# Patient Record
Sex: Female | Born: 1970 | Race: White | Hispanic: No | Marital: Married | Smoking: Never smoker
Health system: Southern US, Community
[De-identification: ages and names within clinical notes are randomized; demographics above are authoritative.]

## PROBLEM LIST (undated history)

## (undated) DIAGNOSIS — F419 Anxiety disorder, unspecified: Secondary | ICD-10-CM

## (undated) DIAGNOSIS — I10 Essential (primary) hypertension: Secondary | ICD-10-CM

## (undated) DIAGNOSIS — E785 Hyperlipidemia, unspecified: Secondary | ICD-10-CM

## (undated) DIAGNOSIS — D649 Anemia, unspecified: Secondary | ICD-10-CM

## (undated) DIAGNOSIS — Z8719 Personal history of other diseases of the digestive system: Secondary | ICD-10-CM

## (undated) DIAGNOSIS — L4050 Arthropathic psoriasis, unspecified: Secondary | ICD-10-CM

## (undated) DIAGNOSIS — E039 Hypothyroidism, unspecified: Secondary | ICD-10-CM

## (undated) DIAGNOSIS — Z5189 Encounter for other specified aftercare: Secondary | ICD-10-CM

## (undated) DIAGNOSIS — K219 Gastro-esophageal reflux disease without esophagitis: Secondary | ICD-10-CM

## (undated) DIAGNOSIS — T7840XA Allergy, unspecified, initial encounter: Secondary | ICD-10-CM

## (undated) HISTORY — DX: Encounter for other specified aftercare: Z51.89

## (undated) HISTORY — DX: Hypothyroidism, unspecified: E03.9

## (undated) HISTORY — DX: Hyperlipidemia, unspecified: E78.5

## (undated) HISTORY — PX: ABDOMINOPLASTY: SUR9

## (undated) HISTORY — DX: Essential (primary) hypertension: I10

## (undated) HISTORY — DX: Allergy, unspecified, initial encounter: T78.40XA

## (undated) HISTORY — PX: COLONOSCOPY: SHX174

## (undated) HISTORY — DX: Anemia, unspecified: D64.9

## (undated) HISTORY — DX: Anxiety disorder, unspecified: F41.9

## (undated) HISTORY — DX: Gastro-esophageal reflux disease without esophagitis: K21.9

## (undated) HISTORY — DX: Arthropathic psoriasis, unspecified: L40.50

---

## 2002-08-02 HISTORY — PX: LASIK: SHX215

## 2004-11-30 HISTORY — PX: CHOLECYSTECTOMY: SHX55

## 2011-06-11 ENCOUNTER — Institutional Professional Consult (permissible substitution): Payer: Self-pay | Admitting: Emergency Medicine

## 2011-08-16 HISTORY — PX: OTHER SURGICAL HISTORY: SHX169

## 2011-08-16 HISTORY — PX: UMBILICAL HERNIA REPAIR: SHX196

## 2011-08-18 ENCOUNTER — Telehealth: Payer: Self-pay

## 2011-08-18 NOTE — Telephone Encounter (Signed)
I spoke with patient-will be here on Thursday 08-26-11 at 1115 consult with CY.

## 2011-08-26 ENCOUNTER — Ambulatory Visit (INDEPENDENT_AMBULATORY_CARE_PROVIDER_SITE_OTHER): Payer: BC Managed Care – PPO | Admitting: Internal Medicine

## 2011-08-26 ENCOUNTER — Encounter: Payer: Self-pay | Admitting: Internal Medicine

## 2011-08-26 VITALS — BP 112/80 | HR 98 | Ht 66.0 in | Wt 311.8 lb

## 2011-08-26 DIAGNOSIS — R0609 Other forms of dyspnea: Secondary | ICD-10-CM

## 2011-08-26 DIAGNOSIS — E662 Morbid (severe) obesity with alveolar hypoventilation: Secondary | ICD-10-CM | POA: Insufficient documentation

## 2011-08-26 DIAGNOSIS — R0902 Hypoxemia: Secondary | ICD-10-CM

## 2011-08-26 DIAGNOSIS — R0600 Dyspnea, unspecified: Secondary | ICD-10-CM | POA: Insufficient documentation

## 2011-08-26 NOTE — Patient Instructions (Signed)
Order- Schedule PFT at [facility] for dx dyspnea  Order- 2D Echocardiogram at [facility]    Dx hypoxia, dyspnea R/O pulmonary hypertension

## 2011-08-29 ENCOUNTER — Encounter: Payer: Self-pay | Admitting: Internal Medicine

## 2011-08-29 NOTE — Progress Notes (Signed)
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 PCP is Dr. Benedetto Goad. Over the past year she has been aware of increased shortness of breath. Tests of all "okay". Primarily she has noted dyspnea on exertion climbing stairs. She has blamed her weight. She recently had gastric sleeve bariatric surgery 1/17/ 2013 at [facility]. She has already lost 28 pounds. She required reintubation in recovery because of hypoxia without need for tracheostomy. She was discharged with home oxygen from [employer], 2 L/M. With her oximeter, walking around home on 1 L, saturation drops to 90%. Sitting quietly on room air saturation is 90%. Office spirometry  performed between May and October of 2012 show an FEV1/FVC 0.86 without significant obstruction. ABG on room air 03/09/2010 record pH 7.43, PCO2 37, PO2 84, HCO3 24.6. Chest CTA 08/19/2011 at [facility] reported no evidence of pulmonary embolism. There was consolidated infiltrate and a small effusion in the left base and hepatosplenomegaly with small hiatal hernia. Medical history significant for ocular myasthenia gravis treated with prednisone 20 mg daily. Methotrexate between 1999 at 2002 for psoriatic arthritis.Treated with Fen Fen in 1990- not diagnosed with pulmonary hypertension. For the past 6 months she has not slept well saying reflux chokes her. She has mild pill dysphagia and sometimes mild stridor. Mother says she gasps some in her sleep. Lives w/ husband and 2 children.  ROS-see HPI Constitutional:   No-   weight loss, night sweats, fevers, chills, fatigue, lassitude. HEENT:   No-  headaches, difficulty swallowing, tooth/dental problems, sore throat,       No-  sneezing, itching, ear ache, nasal congestion, post nasal drip,  CV:  No-   chest pain, orthopnea, PND,  +swelling in  lower extremities,  or No-anasarca, dizziness, palpitations Resp: + shortness of breath with exertion or at rest.              No-   productive cough,  No non-productive cough,  No- coughing up of blood.              No-   change in color of mucus.  No- wheezing.   Skin: No-   rash or lesions. GI:  + heartburn, indigestion,  No-abdominal pain, nausea, vomiting, diarrhea,                 change in bowel habits, loss of appetite GU:  MS:  No-  acute joint pain or swelling.  No- decreased range of motion.  No- back pain. Neuro-     nothing unusual Psych:  No- change in mood or affect. No depression or anxiety.  No memory loss.  OBJ General- Alert, Oriented, Affect-appropriate, Distress- none acute, overweight  (Mother is overweight, in wheel chair because of arthritis). Portable O2 2 L Skin- rash-none, lesions- none, excoriation- none Lymphadenopathy- none Head- atraumatic            Eyes- Gross vision intact, PERRLA, conjunctivae clear secretions            Ears- Hearing, canals-normal            Nose- Clear, no-Septal dev, mucus, polyps, erosion, perforation             Throat- Mallampati II , mucosa clear , drainage- none, tonsils- atrophic Neck- flexible , trachea midline, no stridor , thyroid nl, carotid no bruit Chest - symmetrical excursion ,  unlabored           Heart/CV- RRR , no murmur , no gallop  , no rub, nl s1 s2  P2 does not seem loud                           - JVD- none , edema- none, stasis changes- none, varices- none           Lung- clear to P&A, wheeze- none, cough- none , dullness-none, rub- none    RA O2 sat here 85% sitting           Chest wall-  Abd- tender-no, distended-no, bowel sounds-present, HSM- no Br/ Gen/ Rectal- Not done, not indicated Extrem- cyanosis- none, clubbing, none, atrophy- none, strength- nl Neuro- grossly intact to observation

## 2011-08-29 NOTE — Assessment & Plan Note (Signed)
She had a pneumonia at the time of her CT scan and a gastric surgery in January 2013. Clinically this has cleared. Without evidence of pulmonary embolism or overt heart failure, or hypoxia probably reflects obesity hypoventilation syndrome. This may be complicated by obstructive sleep apnea at night and/or pulmonary hypertension. Plan-full PFT and echocardiogram looking for evidence of pulmonary hypertension, to be done at [facility]. She wants to wait on sleep study. Meanwhile she continues to lose weight after her bariatric surgery.

## 2011-08-30 ENCOUNTER — Telehealth: Payer: Self-pay | Admitting: Internal Medicine

## 2011-08-30 NOTE — Telephone Encounter (Signed)
Will forward to [facility] for this, thanks

## 2011-08-30 NOTE — Telephone Encounter (Signed)
precert # given to [employer] for [employer] [id number]

## 2011-08-30 NOTE — Telephone Encounter (Signed)
Enrique Sack called back & stated this needs to be completed by 4:00 pm today.  Antionette Fairy

## 2011-09-02 ENCOUNTER — Encounter: Payer: Self-pay | Admitting: Internal Medicine

## 2011-09-09 ENCOUNTER — Encounter: Payer: Self-pay | Admitting: Internal Medicine

## 2011-09-17 ENCOUNTER — Encounter: Payer: Self-pay | Admitting: Internal Medicine

## 2011-09-17 ENCOUNTER — Ambulatory Visit (INDEPENDENT_AMBULATORY_CARE_PROVIDER_SITE_OTHER): Payer: BC Managed Care – PPO | Admitting: Internal Medicine

## 2011-09-17 DIAGNOSIS — R0989 Other specified symptoms and signs involving the circulatory and respiratory systems: Secondary | ICD-10-CM

## 2011-09-17 DIAGNOSIS — G35 Multiple sclerosis: Secondary | ICD-10-CM

## 2011-09-17 DIAGNOSIS — E662 Morbid (severe) obesity with alveolar hypoventilation: Secondary | ICD-10-CM

## 2011-09-17 DIAGNOSIS — J90 Pleural effusion, not elsewhere classified: Secondary | ICD-10-CM

## 2011-09-17 DIAGNOSIS — R0600 Dyspnea, unspecified: Secondary | ICD-10-CM

## 2011-09-17 NOTE — Progress Notes (Signed)
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 PCP is Lindsay Hansen. Over the past year she has been aware of increased shortness of breath. Tests of all "okay". Primarily she has noted dyspnea on exertion climbing stairs. She has blamed her weight. She recently had gastric sleeve bariatric surgery 1/17/ 2013 at [facility]. She has already lost 28 pounds. She required reintubation in recovery because of hypoxia without need for tracheostomy. She was discharged with home oxygen from [employer], 2 L/M. With her oximeter, walking around home on 1 L, saturation drops to 90%. Sitting quietly on room air saturation is 90%. Office spirometry  performed between May and October of 2012 show an FEV1/FVC 0.86 without significant obstruction. ABG on room air 03/09/2010 record pH 7.43, PCO2 37, PO2 84, HCO3 24.6. Chest CTA 08/19/2011 at [facility] reported no evidence of pulmonary embolism. There was consolidated infiltrate and a small effusion in the left base and hepatosplenomegaly with small hiatal hernia. Medical history significant for ocular myasthenia gravis treated with prednisone 20 mg daily. Methotrexate between 1999 at 2002 for psoriatic arthritis.Treated with Fen Fen in 1990- not diagnosed with pulmonary hypertension. For the past 6 months she has not slept well saying reflux chokes her. She has mild pill dysphagia and sometimes mild stridor. Mother says she gasps some in her sleep. Lives w/ husband and 2 children.  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 Complicated by obesity hypoventilation, hx MS,  S/P bariatric surgery 08/19/11   PCP is Lindsay Hansen. She has not needed oxygen in 3 weeks. Resting oxygen saturation in  the mid 90% range. Desaturated to 89% walking on a long he'll. Had neurology followup at [facility] for her myasthenia. Speech slurring so he increased her prednisone to 25 mg daily. She will be starting Cell[employer]. She is now back at work full time. Denies cough or wheeze. Denies reflux. Husband tells her she does not snore or stop breathing so she is not planned to have sleep study done. She has lost 27 pounds since her bariatric surgery. PFT: 08/31/2011. Severe restriction. TLC 40%, FEV1/FVC 0.89. Mild reversible obstructive disease and small airways-FEF 25-75% improved to 23% after bronchodilator. DLCO/VA 48%. Echocardiogram 08/31/2011-EF 60-65%. Normal left and right ventricles. No evidence of pulmonary hypertension.  ROS-see HPI Constitutional:   Deliberate  weight loss, night sweats, fevers, chills, fatigue, lassitude. HEENT:   No-  headaches, difficulty swallowing, tooth/dental problems, sore throat,       No-  sneezing, itching, ear ache, nasal congestion, post nasal drip,  CV:  No-   chest pain, orthopnea, PND,  +swelling in lower extremities,  or No-anasarca, dizziness, palpitations Resp: + shortness of breath with exertion or at rest.              No-   productive cough,  No non-productive cough,  No- coughing up of blood.              No-   change in color of mucus.  No- wheezing.   Skin: No-   rash or lesions. GI:  + heartburn, indigestion,  No-abdominal pain, nausea, vomiting, diarrhea,                 change in bowel habits, loss of appetite GU:  MS:  No-  acute  joint pain or swelling.  No- decreased range of motion.  No- back pain. Neuro-     nothing unusual Psych:  No- change in mood or affect. No depression or anxiety.  No memory loss.  OBJ General- Alert, Oriented, Affect-appropriate, Distress- none acute, overweight  (Mother is overweight, in wheel chair because of arthritis). Portable O2 2 L Skin- rash-none, lesions- none, excoriation- none Lymphadenopathy- none Head-  atraumatic            Eyes- Gross vision intact, PERRLA, conjunctivae clear secretions            Ears- Hearing, canals-normal            Nose- Clear, no-Septal dev, mucus, polyps, erosion, perforation             Throat- Mallampati II , mucosa clear , drainage- none, tonsils- atrophic Neck- flexible , trachea midline, no stridor , thyroid nl, carotid no bruit Chest - symmetrical excursion , unlabored           Heart/CV- RRR , no murmur , no gallop  , no rub, nl s1 s2  P2 does not seem loud                           - JVD- none , edema- none, stasis changes- none, varices- none           Lung- clear to P&A, wheeze- none, cough- none , dullness-none, rub- none    RA O2 sat here 85% sitting           Chest wall-  Abd- Br/ Gen/ Rectal- Not done, not indicated Extrem- cyanosis- none, clubbing, none, atrophy- none, strength- nl Neuro- grossly intact to observation

## 2011-09-17 NOTE — Patient Instructions (Signed)
Your PFT shows severe restriction of Total Lung Capacity at this time, and should improve as you lose weight. Re-absorption of the effusion will also help.  There is very mild reactive small airways obstruction, that may be worse if you have a cold, and can be treated with asthma meds as needed.  There is no evidence of pulmonary hypertension or impaired cardiac contractility.  Please call as needed

## 2011-09-19 DIAGNOSIS — J90 Pleural effusion, not elsewhere classified: Secondary | ICD-10-CM | POA: Insufficient documentation

## 2011-09-19 DIAGNOSIS — G35 Multiple sclerosis: Secondary | ICD-10-CM | POA: Insufficient documentation

## 2011-09-19 NOTE — Assessment & Plan Note (Signed)
Small pleural effusion suggested by echocardiogram. This is likely a postoperative inflammatory transudate that will reabsorb.

## 2012-08-02 HISTORY — PX: THYMECTOMY: SHX1063

## 2014-08-02 HISTORY — PX: ABDOMINOPLASTY: SUR9

## 2016-11-18 ENCOUNTER — Other Ambulatory Visit: Payer: Self-pay | Admitting: Obstetrics and Gynecology

## 2016-11-18 DIAGNOSIS — Z9189 Other specified personal risk factors, not elsewhere classified: Secondary | ICD-10-CM

## 2016-12-04 ENCOUNTER — Ambulatory Visit
Admission: RE | Admit: 2016-12-04 | Discharge: 2016-12-04 | Disposition: A | Discharge status: Home / Self Care | Payer: Managed Care, Other (non HMO) | Source: Ambulatory Visit | Attending: Obstetrics and Gynecology | Admitting: Obstetrics and Gynecology

## 2016-12-04 DIAGNOSIS — Z9189 Other specified personal risk factors, not elsewhere classified: Secondary | ICD-10-CM

## 2016-12-04 MED ORDER — GADOBENATE DIMEGLUMINE 529 MG/ML IV SOLN
20.0000 mL | Freq: Once | INTRAVENOUS | Status: AC | PRN
  Administered 2016-12-04: 20 mL via INTRAVENOUS

## 2020-06-19 ENCOUNTER — Encounter: Payer: Self-pay | Admitting: Gastroenterology

## 2020-07-11 ENCOUNTER — Encounter: Payer: Self-pay | Admitting: *Deleted

## 2020-07-28 ENCOUNTER — Ambulatory Visit (AMBULATORY_SURGERY_CENTER): Payer: Self-pay | Admitting: *Deleted

## 2020-07-28 ENCOUNTER — Other Ambulatory Visit: Payer: Self-pay

## 2020-07-28 VITALS — Ht 66.0 in | Wt 298.0 lb

## 2020-07-28 DIAGNOSIS — Z1211 Encounter for screening for malignant neoplasm of colon: Secondary | ICD-10-CM

## 2020-07-28 MED ORDER — CLENPIQ 10-3.5-12 MG-GM -GM/160ML PO SOLN: 1.0000 | ORAL | 0 refills | Status: DC

## 2020-07-28 NOTE — Progress Notes (Signed)
No egg or soy allergy known to patient  No issues with past sedation with any surgeries or procedures No intubation problems in the past  No FH of Malignant Hyperthermia No diet pills per patient No home 02 use per patient  No blood thinners per patient  Pt denies issues with constipation  No A fib or A flutter  EMMI video to pt or via MyChart  COVID 19 guidelines implemented in PV today with Pt and RN  Pt is fully vaccinated  for Covid   Clenpiq Coupon given to pt in PV today , Code to Pharmacy   Due to the COVID-19 pandemic we are asking patients to follow certain guidelines.  Pt aware of COVID protocols and [facility] guidelines   

## 2020-08-02 HISTORY — PX: BACK SURGERY: SHX140

## 2020-08-05 ENCOUNTER — Encounter: Payer: Self-pay | Admitting: Gastroenterology

## 2020-08-11 ENCOUNTER — Encounter: Payer: Self-pay | Admitting: Gastroenterology

## 2020-08-11 ENCOUNTER — Ambulatory Visit (AMBULATORY_SURGERY_CENTER): Payer: 59 | Admitting: Gastroenterology

## 2020-08-11 ENCOUNTER — Other Ambulatory Visit: Payer: Self-pay

## 2020-08-11 VITALS — BP 123/78 | HR 83 | Temp 97.1°F | Resp 16 | Ht 66.0 in | Wt 298.0 lb

## 2020-08-11 DIAGNOSIS — D129 Benign neoplasm of anus and anal canal: Secondary | ICD-10-CM

## 2020-08-11 DIAGNOSIS — Z1211 Encounter for screening for malignant neoplasm of colon: Secondary | ICD-10-CM

## 2020-08-11 DIAGNOSIS — D120 Benign neoplasm of cecum: Secondary | ICD-10-CM

## 2020-08-11 DIAGNOSIS — D122 Benign neoplasm of ascending colon: Secondary | ICD-10-CM | POA: Diagnosis not present

## 2020-08-11 DIAGNOSIS — D125 Benign neoplasm of sigmoid colon: Secondary | ICD-10-CM | POA: Diagnosis not present

## 2020-08-11 DIAGNOSIS — D128 Benign neoplasm of rectum: Secondary | ICD-10-CM

## 2020-08-11 MED ORDER — SODIUM CHLORIDE 0.9 % IV SOLN: 500.0000 mL | Freq: Once | INTRAVENOUS | Status: DC

## 2020-08-11 NOTE — Progress Notes (Signed)
Called to room to assist during endoscopic procedure.  Patient ID and intended procedure confirmed with present staff. Received instructions for my participation in the procedure from the performing physician.  

## 2020-08-11 NOTE — Op Note (Signed)
[facility] Patient Name: Lindsay Hansen Procedure Date: 08/11/2020 10:02 AM MRN: [id number] Endoscopist: Lynann Bologna , MD Age: 50 Referring MD:  Date of Birth: June 05, 1971 Gender: Female Account #: [id number] Procedure:                Colonoscopy Indications:              Screening for colorectal malignant neoplasm Medicines:                Monitored Anesthesia Care Procedure:                Pre-Anesthesia Assessment:                           - Prior to the procedure, a History and Physical                            was performed, and patient medications and                            allergies were reviewed. The patient's tolerance of                            previous anesthesia was also reviewed. The risks                            and benefits of the procedure and the sedation                            options and risks were discussed with the patient.                            All questions were answered, and informed consent                            was obtained. Prior Anticoagulants: The patient has                            taken no previous anticoagulant or antiplatelet                            agents. ASA Grade Assessment: II - A patient with                            mild systemic disease. After reviewing the risks                            and benefits, the patient was deemed in                            satisfactory condition to undergo the procedure.                           After obtaining informed consent, the colonoscope  was passed under direct vision. Throughout the                            procedure, the patient's blood pressure, pulse, and                            oxygen saturations were monitored continuously. The                            Olympus PCF-H190DL (#[id number]) Colonoscope was                            introduced through the anus and advanced to the 2                            cm into the ileum.  The colonoscopy was performed                            without difficulty. The patient tolerated the                            procedure well. The quality of the bowel                            preparation was good. The terminal ileum, ileocecal                            valve, appendiceal orifice, and rectum were                            photographed. Scope In: 10:07:14 AM Scope Out: 10:23:51 AM Scope Withdrawal Time: 0 hours 14 minutes 3 seconds  Total Procedure Duration: 0 hours 16 minutes 37 seconds  Findings:                 Two sessile polyps were found in the proximal                            ascending colon and cecum. The polyps were 4 to 6                            mm in size. These polyps were removed with a cold                            snare. Resection and retrieval were complete.                           Four sessile polyps were found in the rectum and                            distal sigmoid colon. The polyps were 2 to 4 mm in  size. These polyps were removed with a cold snare.                            Resection and retrieval were complete.                           A few rare small-mouthed diverticula were found in                            the sigmoid colon.                           Non-bleeding internal hemorrhoids were found during                            retroflexion. The hemorrhoids were small.                           The terminal ileum appeared normal.                           The exam was otherwise without abnormality on                            direct and retroflexion views. Complications:            No immediate complications. Estimated Blood Loss:     Estimated blood loss: none. Impression:               - Small colonic polyps s/p polypectomy.                           - Minimal sigmoid diverticulosis.                           - Otherwise normal colonoscopy to TI. Recommendation:           - Patient has a  contact number available for                            emergencies. The signs and symptoms of potential                            delayed complications were discussed with the                            patient. Return to normal activities tomorrow.                            Written discharge instructions were provided to the                            patient.                           - Resume previous diet.                           -  Continue present medications.                           - Await pathology results.                           - Repeat colonoscopy for surveillance based on                            pathology results.                           - The findings and recommendations were discussed                            with Lindsay Hansen. Jackquline Denmark, MD 08/11/2020 10:32:42 AM This report has been signed electronically.

## 2020-08-11 NOTE — Patient Instructions (Signed)
Handouts given:  Polyps, Diverticulosis Resume previous diet Continue current medications Await pathology results YOU HAD AN ENDOSCOPIC PROCEDURE TODAY AT THE [facility]:   Refer to the procedure report that was given to you for any specific questions about what was found during the examination.  If the procedure report does not answer your questions, please call your gastroenterologist to clarify.  If you requested that your care partner not be given the details of your procedure findings, then the procedure report has been included in a sealed envelope for you to review at your convenience later.  YOU SHOULD EXPECT: Some feelings of bloating in the abdomen. Passage of more gas than usual.  Walking can help get rid of the air that was put into your GI tract during the procedure and reduce the bloating. If you had a lower endoscopy (such as a colonoscopy or flexible sigmoidoscopy) you may notice spotting of blood in your stool or on the toilet paper. If you underwent a bowel prep for your procedure, you may not have a normal bowel movement for a few days.  Please Note:  You might notice some irritation and congestion in your nose or some drainage.  This is from the oxygen used during your procedure.  There is no need for concern and it should clear up in a day or so.  SYMPTOMS TO REPORT IMMEDIATELY:   Following lower endoscopy (colonoscopy or flexible sigmoidoscopy):  Excessive amounts of blood in the stool  Significant tenderness or worsening of abdominal pains  Swelling of the abdomen that is new, acute  Fever of 100F or higher  For urgent or emergent issues, a gastroenterologist can be reached at any hour by calling [phone]. Do not use MyChart messaging for urgent concerns.   DIET:  We do recommend a small meal at first, but then you may proceed to your regular diet.  Drink plenty of fluids but you should avoid alcoholic beverages for 24 hours.  ACTIVITY:  You should  plan to take it easy for the rest of today and you should NOT DRIVE or use heavy machinery until tomorrow (because of the sedation medicines used during the test).    FOLLOW UP: Our staff will call the number listed on your records 48-72 hours following your procedure to check on you and address any questions or concerns that you may have regarding the information given to you following your procedure. If we do not reach you, we will leave a message.  We will attempt to reach you two times.  During this call, we will ask if you have developed any symptoms of COVID 19. If you develop any symptoms (ie: fever, flu-like symptoms, shortness of breath, cough etc.) before then, please call [phone].  If you test positive for Covid 19 in the 2 weeks post procedure, please call and report this information to us.    If any biopsies were taken you will be contacted by phone or by letter within the next 1-3 weeks.  Please call us at [phone] if you have not heard about the biopsies in 3 weeks.   SIGNATURES/CONFIDENTIALITY: You and/or your care partner have signed paperwork which will be entered into your electronic medical record.  These signatures attest to the fact that that the information above on your After Visit Summary has been reviewed and is understood.  Full responsibility of the confidentiality of this discharge information lies with you and/or your care-partner. 

## 2020-08-11 NOTE — Addendum Note (Signed)
Addended by: Lanier Prude A on: 08/11/2020 03:11 PM   Modules accepted: Orders

## 2020-08-11 NOTE — Progress Notes (Signed)
Pt's states no medical or surgical changes since previsit or office visit.   VS taken by CW 

## 2020-08-11 NOTE — Progress Notes (Signed)
Report to PACU, RN, vss, BBS= Clear.  

## 2020-08-13 ENCOUNTER — Telehealth: Payer: Self-pay | Admitting: *Deleted

## 2020-08-13 NOTE — Telephone Encounter (Signed)
First follow up call attempt.  Message left to call with any questions or concerns. 

## 2020-08-18 ENCOUNTER — Encounter: Payer: Self-pay | Admitting: Gastroenterology

## 2020-12-15 ENCOUNTER — Other Ambulatory Visit: Payer: Self-pay | Admitting: Specialist

## 2020-12-15 DIAGNOSIS — M545 Low back pain, unspecified: Secondary | ICD-10-CM

## 2020-12-15 DIAGNOSIS — R29898 Other symptoms and signs involving the musculoskeletal system: Secondary | ICD-10-CM

## 2020-12-25 ENCOUNTER — Other Ambulatory Visit: Payer: 59

## 2021-06-11 ENCOUNTER — Other Ambulatory Visit: Payer: Self-pay | Admitting: Neurosurgery

## 2021-06-15 ENCOUNTER — Encounter (HOSPITAL_COMMUNITY): Payer: Self-pay

## 2021-06-15 NOTE — Progress Notes (Signed)
DUE TO COVID-19 ONLY ONE VISITOR IS ALLOWED TO COME WITH YOU AND STAY IN THE WAITING ROOM ONLY DURING PRE OP AND PROCEDURE DAY OF SURGERY.   Two VISITORS MAY VISIT WITH YOU AFTER SURGERY IN YOUR PRIVATE ROOM DURING VISITING HOURS ONLY!  Cardiologist - n/a Internal Med - Edyth Gunnels, NP / Dr Bea Graff Neurology - Dr Tedra Coupe  Chest x-ray - n/a EKG - 06/16/21 Stress Test - n/a ECHO - 08/31/11 Cardiac Cath - n/a  ICD Pacemaker/Loop - n/a  Sleep Study -  n/a CPAP - none  Anesthesia review: Yes  STOP now taking any Aspirin (unless otherwise instructed by your surgeon), Aleve, Naproxen, Ibuprofen, Motrin, Advil, Goody's, BC's, all herbal medications, fish oil, and all vitamins.   Coronavirus Screening Covid test is scheduled on 06/16/21. Do you have any of the following symptoms:  Cough yes/no: No Fever (>100.2F)  yes/no: No Runny nose yes/no: No Sore throat yes/no: No Difficulty breathing/shortness of breath  yes/no: No  Have you traveled in the last 14 days and where? yes/no: No  Patient verbalized understanding of instructions that were given to them at the PAT appointment. Patient was also instructed that they will need to review over the PAT instructions again at home before surgery.

## 2021-06-15 NOTE — Pre-Procedure Instructions (Signed)
Surgical Instructions    Your procedure is scheduled on Wednesday, 06/17/21.  Report to [facility] Main Entrance "A" at 12:50 P.M., then check in with the Admitting office.  Call this number if you have problems the morning of surgery:  [phone]   If you have any questions prior to your surgery date call [phone]: Open Monday-Friday 8am-4pm    Remember:  Do not eat or drink after midnight the night before your surgery    Take these medicines the morning of surgery with A SIP OF WATER:  lansoprazole (PREVACID) levothyroxine (SYNTHROID)  rosuvastatin (CRESTOR)  sertraline (ZOLOFT)    Take these medicines the morning of surgery with A SIP OF WATER AS NEEDED:  acetaminophen (TYLENOL) diazepam (VALIUM)  famotidine (PEPCID) oxyCODONE-acetaminophen (PERCOCET/ROXICET) promethazine (PHENERGAN)   As of today, STOP taking any celecoxib (CELEBREX), Aspirin (unless otherwise instructed by your surgeon) Aleve, Naproxen, Ibuprofen, Motrin, Advil, Goody's, BC's, all herbal medications, fish oil, and all vitamins.    After your COVID test   You are not required to quarantine however you are required to wear a well-fitting mask when you are out and around people not in your household.  If your mask becomes wet or soiled, replace with a new one.  Wash your hands often with soap and water for 20 seconds or clean your hands with an alcohol-based hand sanitizer that contains at least 60% alcohol.  Do not share personal items.  Notify your provider: if you are in close contact with someone who has COVID  or if you develop a fever of 100.4 or greater, sneezing, cough, sore throat, shortness of breath or body aches.             Do not wear jewelry or makeup Do not wear lotions, powders, perfumes, or deodorant. Do not shave 48 hours prior to surgery.   Do not bring valuables to the hospital. DO Not wear nail polish, gel polish, artificial nails, or any other type of covering on  natural nails including finger and toenails. If patients have artificial nails, gel coating, etc. that need to be removed by a nail salon, please have this removed prior to surgery or surgery may need to be canceled/delayed if the surgeon/ anesthesia feels like the patient is unable to be adequately monitored.             [facility] is not responsible for any belongings or valuables.  Do NOT Smoke (Tobacco/Vaping)  24 hours prior to your procedure  If you use a CPAP at night, you may bring your mask for your overnight stay.   Contacts, glasses, hearing aids, dentures or partials may not be worn into surgery, please bring cases for these belongings   For patients admitted to the hospital, discharge time will be determined by your treatment team.   Patients discharged the day of surgery will not be allowed to drive home, and someone needs to stay with them for 24 hours.  NO VISITORS WILL BE ALLOWED IN PRE-OP WHERE PATIENTS ARE PREPPED FOR SURGERY.  ONLY 1 SUPPORT PERSON MAY BE PRESENT IN THE WAITING ROOM WHILE YOU ARE IN SURGERY.  IF YOU ARE TO BE ADMITTED, ONCE YOU ARE IN YOUR ROOM YOU WILL BE ALLOWED TWO (2) VISITORS. 1 (ONE) VISITOR MAY STAY OVERNIGHT BUT MUST ARRIVE TO THE ROOM BY 8pm.  Minor children may have two parents present. Special consideration for safety and communication needs will be reviewed on a case by case basis.  Special instructions:  Oral Hygiene is also important to reduce your risk of infection.  Remember - BRUSH YOUR TEETH THE MORNING OF SURGERY WITH YOUR REGULAR TOOTHPASTE   [place]- Preparing For Surgery  Before surgery, you can play an important role. Because skin is not sterile, your skin needs to be as free of germs as possible. You can reduce the number of germs on your skin by washing with CHG (chlorahexidine gluconate) Soap before surgery.  CHG is an antiseptic cleaner which kills germs and bonds with the skin to continue killing germs even after washing.      Please do not use if you have an allergy to CHG or antibacterial soaps. If your skin becomes reddened/irritated stop using the CHG.  Do not shave (including legs and underarms) for at least 48 hours prior to first CHG shower. It is OK to shave your face.  Please follow these instructions carefully.     Shower the NIGHT BEFORE SURGERY and the MORNING OF SURGERY with CHG Soap.   If you chose to wash your hair, wash your hair first as usual with your normal shampoo. After you shampoo, rinse your hair and body thoroughly to remove the shampoo.  Then [employer] and genitals (private parts) with your normal soap and rinse thoroughly to remove soap.  After that Use CHG Soap as you would any other liquid soap. You can apply CHG directly to the skin and wash gently with a scrungie or a clean washcloth.   Apply the CHG Soap to your body ONLY FROM THE NECK DOWN.  Do not use on open wounds or open sores. Avoid contact with your eyes, ears, mouth and genitals (private parts). Wash Face and genitals (private parts)  with your normal soap.   Wash thoroughly, paying special attention to the area where your surgery will be performed.  Thoroughly rinse your body with warm water from the neck down.  DO NOT shower/wash with your normal soap after using and rinsing off the CHG Soap.  Pat yourself dry with a CLEAN TOWEL.  Wear CLEAN PAJAMAS to bed the night before surgery  Place CLEAN SHEETS on your bed the night before your surgery  DO NOT SLEEP WITH PETS.   Day of Surgery:  Take a shower with CHG soap. Wear Clean/Comfortable clothing the morning of surgery Do not apply any deodorants/lotions.   Remember to brush your teeth WITH YOUR REGULAR TOOTHPASTE.   Please read over the following fact sheets that you were given.

## 2021-06-16 ENCOUNTER — Other Ambulatory Visit: Payer: Self-pay | Admitting: Neurosurgery

## 2021-06-16 ENCOUNTER — Encounter (HOSPITAL_COMMUNITY)
Admission: RE | Admit: 2021-06-16 | Discharge: 2021-06-16 | Disposition: A | Discharge status: Home / Self Care | Payer: Managed Care, Other (non HMO) | Source: Ambulatory Visit | Attending: Neurosurgery | Admitting: Neurosurgery

## 2021-06-16 ENCOUNTER — Other Ambulatory Visit: Payer: Self-pay

## 2021-06-16 ENCOUNTER — Encounter (HOSPITAL_COMMUNITY): Payer: Self-pay

## 2021-06-16 VITALS — BP 164/106 | HR 72 | Temp 97.7°F | Resp 19 | Ht 65.5 in | Wt 298.0 lb

## 2021-06-16 DIAGNOSIS — L4050 Arthropathic psoriasis, unspecified: Secondary | ICD-10-CM | POA: Insufficient documentation

## 2021-06-16 DIAGNOSIS — Z20822 Contact with and (suspected) exposure to covid-19: Secondary | ICD-10-CM | POA: Insufficient documentation

## 2021-06-16 DIAGNOSIS — Z79899 Other long term (current) drug therapy: Secondary | ICD-10-CM | POA: Diagnosis not present

## 2021-06-16 DIAGNOSIS — Z01818 Encounter for other preprocedural examination: Secondary | ICD-10-CM | POA: Insufficient documentation

## 2021-06-16 LAB — TYPE AND SCREEN
ABO/RH(D): O POS
Antibody Screen: NEGATIVE

## 2021-06-16 LAB — BASIC METABOLIC PANEL
Anion gap: 7 (ref 5–15)
BUN: 13 mg/dL (ref 6–20)
CO2: 30 mmol/L (ref 22–32)
Calcium: 9.2 mg/dL (ref 8.9–10.3)
Chloride: 99 mmol/L (ref 98–111)
Creatinine, Ser: 0.88 mg/dL (ref 0.44–1.00)
GFR, Estimated: >60 mL/min (ref >60)
Glucose, Bld: 98 mg/dL (ref 70–99)
Potassium: 3.9 mmol/L (ref 3.5–5.1)
Sodium: 136 mmol/L (ref 135–145)

## 2021-06-16 LAB — CBC
HCT: 37.7 % (ref 36.0–46.0)
Hemoglobin: 12 g/dL (ref 12.0–15.0)
MCH: 28.2 pg (ref 26.0–34.0)
MCHC: 31.8 g/dL (ref 30.0–36.0)
MCV: 88.5 fL (ref 80.0–100.0)
Platelets: 321 10*3/uL (ref 150–400)
RBC: 4.26 MIL/uL (ref 3.87–5.11)
RDW: 15.9 % — ABNORMAL HIGH (ref 11.5–15.5)
WBC: 9 10*3/uL (ref 4.0–10.5)
nRBC: 0 % (ref 0.0–0.2)

## 2021-06-16 LAB — SARS CORONAVIRUS 2 (TAT 6-24 HRS): SARS Coronavirus 2: NEGATIVE

## 2021-06-16 LAB — SURGICAL PCR SCREEN
MRSA, PCR: NEGATIVE
Staphylococcus aureus: NEGATIVE

## 2021-06-16 NOTE — Anesthesia Preprocedure Evaluation (Addendum)
Anesthesia Evaluation  Patient identified by MRN, date of birth, ID band Patient awake    Reviewed: Allergy & Precautions, NPO status , Patient's Chart, lab work & pertinent test results  Airway Mallampati: III  TM Distance: >3 FB Neck ROM: Full    Dental  (+) Teeth Intact, Dental Advisory Given   Pulmonary  Denies snoring at night    Pulmonary exam normal breath sounds clear to auscultation       Cardiovascular hypertension (152/93 in preop, normally 120-130s SBP), Pt. on medications Normal cardiovascular exam Rhythm:Regular Rate:Normal     Neuro/Psych PSYCHIATRIC DISORDERS Anxiety Follows with neurology at [facility] for history of myasthenia gravis.  Her symptoms are predominantly ocular bulbar.  Last seen 02/05/2021.  Per note, "Impression: Lindsay Hansen is a 50 y.o. female with seropositive myasthenia gravis presenting for followup. Largely and currently asymptomatic on hydrocortisone 20/10 mg and off of MG specific immunosuppression (though she is being treated for psoriatic arthritis).   Neuromuscular disease    GI/Hepatic Neg liver ROS, GERD  Controlled and Medicated,S/p gastric sleeve    Endo/Other  Hypothyroidism   Renal/GU negative Renal ROS  negative genitourinary   Musculoskeletal  (+) Arthritis , Osteoarthritis,  Psoriatic arthritis- 10mg  prednisone daily for many years   Abdominal (+) + obese,   Peds negative pediatric ROS (+)  Hematology negative hematology ROS (+) hct 37.7   Anesthesia Other Findings Has been taking oxy 10mg  for last week  Reproductive/Obstetrics negative OB ROS                           Anesthesia Physical Anesthesia Plan  ASA: 3  Anesthesia Plan: General   Post-op Pain Management:    Induction: Intravenous  PONV Risk Score and Plan: 3 and Ondansetron, Dexamethasone, Midazolam and Treatment may vary due to age or medical condition  Airway  Management Planned: Oral ETT  Additional Equipment: None  Intra-op Plan:   Post-operative Plan: Extubation in OR  Informed Consent: I have reviewed the patients History and Physical, chart, labs and discussed the procedure including the risks, benefits and alternatives for the proposed anesthesia with the patient or authorized representative who has indicated his/her understanding and acceptance.     Dental advisory given  Plan Discussed with: CRNA  Anesthesia Plan Comments: (  )       Anesthesia Quick Evaluation

## 2021-06-16 NOTE — Progress Notes (Signed)
Anesthesia Chart Review:  Follows with neurology at [facility] for history of myasthenia gravis.  Her symptoms are predominantly ocular bulbar.  Last seen 02/05/2021.  Per note, "Impression: Lindsay Hansen is a 50 y.o. female with seropositive myasthenia gravis presenting for followup. Largely and currently asymptomatic on hydrocortisone 20/10 mg and off of MG specific immunosuppression (though she is being treated for psoriatic arthritis). She recently had symptoms suggestive of neurogenic claudication with MRI lumbar spine showing moderate L3-L4 canal stenosis, symptoms improved with steroid injection. She is being followed locally by neurosurgery for this."  She was advised to follow-up in 1 year.  History of psoriatic arthritis, maintained on Rinvoq.  History of gastric sleeve 2013.  Hypertension not well controlled preop testing appointment, 164/106.  Preop labs reviewed, unremarkable.  EKG 06/16/2021: NSR.  Rate 64.   Wynonia Musty [facility] Short Stay Center/Anesthesiology Phone [phone] 06/16/2021 2:55 PM

## 2021-06-17 ENCOUNTER — Ambulatory Visit (HOSPITAL_COMMUNITY): Payer: Managed Care, Other (non HMO)

## 2021-06-17 ENCOUNTER — Ambulatory Visit (HOSPITAL_COMMUNITY): Payer: Managed Care, Other (non HMO) | Admitting: Certified Registered Nurse Anesthetist

## 2021-06-17 ENCOUNTER — Ambulatory Visit (HOSPITAL_COMMUNITY): Payer: Managed Care, Other (non HMO) | Admitting: Physician Assistant

## 2021-06-17 ENCOUNTER — Other Ambulatory Visit: Payer: Self-pay

## 2021-06-17 ENCOUNTER — Encounter (HOSPITAL_COMMUNITY): Payer: Self-pay | Admitting: Neurosurgery

## 2021-06-17 ENCOUNTER — Observation Stay (HOSPITAL_COMMUNITY)
Admission: RE | Admit: 2021-06-17 | Discharge: 2021-06-18 | Disposition: A | Discharge status: Home / Self Care | Payer: Managed Care, Other (non HMO) | Attending: Neurosurgery | Admitting: Neurosurgery

## 2021-06-17 ENCOUNTER — Encounter (HOSPITAL_COMMUNITY)
Admission: RE | Disposition: A | Discharge status: Home / Self Care | Payer: Self-pay | Source: Home / Self Care | Attending: Neurosurgery

## 2021-06-17 DIAGNOSIS — Z79899 Other long term (current) drug therapy: Secondary | ICD-10-CM | POA: Diagnosis not present

## 2021-06-17 DIAGNOSIS — M4316 Spondylolisthesis, lumbar region: Secondary | ICD-10-CM | POA: Insufficient documentation

## 2021-06-17 DIAGNOSIS — I10 Essential (primary) hypertension: Secondary | ICD-10-CM | POA: Insufficient documentation

## 2021-06-17 DIAGNOSIS — M48062 Spinal stenosis, lumbar region with neurogenic claudication: Principal | ICD-10-CM | POA: Insufficient documentation

## 2021-06-17 DIAGNOSIS — E039 Hypothyroidism, unspecified: Secondary | ICD-10-CM | POA: Diagnosis not present

## 2021-06-17 DIAGNOSIS — Z419 Encounter for procedure for purposes other than remedying health state, unspecified: Secondary | ICD-10-CM

## 2021-06-17 LAB — ABO/RH: ABO/RH(D): O POS

## 2021-06-17 SURGERY — POSTERIOR LUMBAR FUSION 1 LEVEL
Anesthesia: General

## 2021-06-17 MED ORDER — CHLORHEXIDINE GLUCONATE CLOTH 2 % EX PADS: 6.0000 | MEDICATED_PAD | Freq: Once | CUTANEOUS | Status: DC

## 2021-06-17 MED ORDER — CYANOCOBALAMIN 1000 MCG/ML IJ KIT: 1000.0000 ug | PACK | INTRAMUSCULAR | Status: DC

## 2021-06-17 MED ORDER — PROPOFOL 10 MG/ML IV BOLUS
INTRAVENOUS | Status: AC
  Filled 2021-06-17: qty 20

## 2021-06-17 MED ORDER — ESTRADIOL 0.1 MG/24HR TD PTWK: 0.1000 mg | MEDICATED_PATCH | TRANSDERMAL | Status: DC

## 2021-06-17 MED ORDER — VITAMIN D (ERGOCALCIFEROL) 1.25 MG (50000 UNIT) PO CAPS: 50000.0000 [IU] | ORAL_CAPSULE | ORAL | Status: DC

## 2021-06-17 MED ORDER — LIDOCAINE-EPINEPHRINE 1 %-1:100000 IJ SOLN
INTRAMUSCULAR | Status: AC
  Filled 2021-06-17: qty 1

## 2021-06-17 MED ORDER — ACETAMINOPHEN 650 MG RE SUPP: 650.0000 mg | RECTAL | Status: DC | PRN

## 2021-06-17 MED ORDER — LIDOCAINE-EPINEPHRINE 1 %-1:100000 IJ SOLN
INTRAMUSCULAR | Status: DC | PRN
  Administered 2021-06-17: 5 mL

## 2021-06-17 MED ORDER — HYDROMORPHONE HCL 1 MG/ML IJ SOLN
INTRAMUSCULAR | Status: DC | PRN
  Administered 2021-06-17: .5 mg via INTRAVENOUS

## 2021-06-17 MED ORDER — MIDAZOLAM HCL 2 MG/2ML IJ SOLN
INTRAMUSCULAR | Status: AC
  Filled 2021-06-17: qty 2

## 2021-06-17 MED ORDER — PANTOPRAZOLE SODIUM 40 MG PO TBEC
40.0000 mg | DELAYED_RELEASE_TABLET | Freq: Every day | ORAL | Status: DC
  Administered 2021-06-18: 09:00:00 40 mg via ORAL
  Filled 2021-06-17: qty 1

## 2021-06-17 MED ORDER — DEXAMETHASONE SODIUM PHOSPHATE 10 MG/ML IJ SOLN
INTRAMUSCULAR | Status: AC
  Filled 2021-06-17: qty 2

## 2021-06-17 MED ORDER — OXYCODONE HCL 5 MG PO TABS
10.0000 mg | ORAL_TABLET | ORAL | Status: DC | PRN
  Administered 2021-06-17 – 2021-06-18 (×6): 10 mg via ORAL
  Filled 2021-06-17 (×6): qty 2

## 2021-06-17 MED ORDER — LEVOTHYROXINE SODIUM 112 MCG PO TABS
112.0000 ug | ORAL_TABLET | Freq: Every day | ORAL | Status: DC
  Administered 2021-06-18: 07:00:00 112 ug via ORAL
  Filled 2021-06-17 (×2): qty 1

## 2021-06-17 MED ORDER — FENTANYL CITRATE (PF) 250 MCG/5ML IJ SOLN
INTRAMUSCULAR | Status: DC | PRN
  Administered 2021-06-17: 50 ug via INTRAVENOUS
  Administered 2021-06-17: 25 ug via INTRAVENOUS
  Administered 2021-06-17 (×2): 50 ug via INTRAVENOUS
  Administered 2021-06-17 (×3): 25 ug via INTRAVENOUS

## 2021-06-17 MED ORDER — DOCUSATE SODIUM 100 MG PO CAPS
100.0000 mg | ORAL_CAPSULE | Freq: Two times a day (BID) | ORAL | Status: DC
  Administered 2021-06-17 – 2021-06-18 (×2): 100 mg via ORAL
  Filled 2021-06-17 (×2): qty 1

## 2021-06-17 MED ORDER — THROMBIN 5000 UNITS EX SOLR
OROMUCOSAL | Status: DC | PRN
  Administered 2021-06-17: 5 mL via TOPICAL

## 2021-06-17 MED ORDER — LIDOCAINE 2% (20 MG/ML) 5 ML SYRINGE
INTRAMUSCULAR | Status: AC
  Filled 2021-06-17: qty 10

## 2021-06-17 MED ORDER — SODIUM CHLORIDE 0.9 % IV SOLN: INTRAVENOUS | Status: DC

## 2021-06-17 MED ORDER — LIDOCAINE 2% (20 MG/ML) 5 ML SYRINGE
INTRAMUSCULAR | Status: DC | PRN
  Administered 2021-06-17: 60 mg via INTRAVENOUS

## 2021-06-17 MED ORDER — BISACODYL 10 MG RE SUPP: 10.0000 mg | Freq: Every day | RECTAL | Status: DC | PRN

## 2021-06-17 MED ORDER — POTASSIUM CHLORIDE CRYS ER 20 MEQ PO TBCR
10.0000 meq | EXTENDED_RELEASE_TABLET | Freq: Every day | ORAL | Status: DC
  Filled 2021-06-17: qty 1

## 2021-06-17 MED ORDER — BUPIVACAINE HCL (PF) 0.5 % IJ SOLN
INTRAMUSCULAR | Status: AC
  Filled 2021-06-17: qty 30

## 2021-06-17 MED ORDER — CHLORHEXIDINE GLUCONATE 0.12 % MT SOLN: 15.0000 mL | Freq: Once | OROMUCOSAL | Status: DC

## 2021-06-17 MED ORDER — ACETAMINOPHEN 10 MG/ML IV SOLN
INTRAVENOUS | Status: DC | PRN
  Administered 2021-06-17: 1000 mg via INTRAVENOUS

## 2021-06-17 MED ORDER — CHLORHEXIDINE GLUCONATE 0.12 % MT SOLN
OROMUCOSAL | Status: AC
  Administered 2021-06-17: 15 mL
  Filled 2021-06-17: qty 15

## 2021-06-17 MED ORDER — EPHEDRINE SULFATE 50 MG/ML IJ SOLN
INTRAMUSCULAR | Status: DC | PRN
Start: 2021-06-17 — End: 2021-06-17
  Administered 2021-06-17: 10 mg via INTRAVENOUS
  Administered 2021-06-17: 5 mg via INTRAVENOUS

## 2021-06-17 MED ORDER — ONDANSETRON HCL 4 MG/2ML IJ SOLN
INTRAMUSCULAR | Status: DC | PRN
  Administered 2021-06-17: 4 mg via INTRAVENOUS

## 2021-06-17 MED ORDER — OXYCODONE HCL 5 MG/5ML PO SOLN: 5.0000 mg | Freq: Once | ORAL | Status: DC | PRN

## 2021-06-17 MED ORDER — PROGESTERONE 200 MG PO CAPS
200.0000 mg | ORAL_CAPSULE | Freq: Every day | ORAL | Status: DC
  Filled 2021-06-17: qty 1

## 2021-06-17 MED ORDER — SODIUM CHLORIDE 0.9 % IV SOLN: 250.0000 mL | INTRAVENOUS | Status: DC

## 2021-06-17 MED ORDER — OXYCODONE HCL 5 MG PO TABS: 5.0000 mg | ORAL_TABLET | Freq: Once | ORAL | Status: DC | PRN

## 2021-06-17 MED ORDER — CLOTRIMAZOLE-BETAMETHASONE 1-0.05 % EX LOTN: 1.0000 "application " | TOPICAL_LOTION | Freq: Every day | CUTANEOUS | Status: DC | PRN

## 2021-06-17 MED ORDER — METHOCARBAMOL 500 MG PO TABS
500.0000 mg | ORAL_TABLET | Freq: Four times a day (QID) | ORAL | Status: DC | PRN
  Administered 2021-06-17 – 2021-06-18 (×3): 500 mg via ORAL
  Filled 2021-06-17 (×3): qty 1

## 2021-06-17 MED ORDER — FENTANYL CITRATE (PF) 250 MCG/5ML IJ SOLN
INTRAMUSCULAR | Status: AC
  Filled 2021-06-17: qty 5

## 2021-06-17 MED ORDER — HYDROMORPHONE HCL 1 MG/ML IJ SOLN
INTRAMUSCULAR | Status: AC
  Filled 2021-06-17: qty 0.5

## 2021-06-17 MED ORDER — SENNA 8.6 MG PO TABS
1.0000 | ORAL_TABLET | Freq: Two times a day (BID) | ORAL | Status: DC
  Administered 2021-06-17: 8.6 mg via ORAL
  Filled 2021-06-17: qty 1

## 2021-06-17 MED ORDER — METHOCARBAMOL 1000 MG/10ML IJ SOLN
500.0000 mg | Freq: Four times a day (QID) | INTRAVENOUS | Status: DC | PRN
  Filled 2021-06-17: qty 5

## 2021-06-17 MED ORDER — PHENYLEPHRINE 40 MCG/ML (10ML) SYRINGE FOR IV PUSH (FOR BLOOD PRESSURE SUPPORT)
PREFILLED_SYRINGE | INTRAVENOUS | Status: AC
  Filled 2021-06-17: qty 20

## 2021-06-17 MED ORDER — HYDROMORPHONE HCL 1 MG/ML IJ SOLN
INTRAMUSCULAR | Status: AC
  Filled 2021-06-17: qty 1

## 2021-06-17 MED ORDER — SERTRALINE HCL 50 MG PO TABS
150.0000 mg | ORAL_TABLET | Freq: Every day | ORAL | Status: DC
  Administered 2021-06-18: 09:00:00 150 mg via ORAL
  Filled 2021-06-17: qty 3

## 2021-06-17 MED ORDER — LACTATED RINGERS IV SOLN: INTRAVENOUS | Status: DC

## 2021-06-17 MED ORDER — PREDNISONE 10 MG PO TABS
10.0000 mg | ORAL_TABLET | Freq: Every day | ORAL | Status: DC
  Administered 2021-06-18: 09:00:00 10 mg via ORAL
  Filled 2021-06-17 (×2): qty 1

## 2021-06-17 MED ORDER — SUGAMMADEX SODIUM 200 MG/2ML IV SOLN
INTRAVENOUS | Status: DC | PRN
  Administered 2021-06-17: 400 mg via INTRAVENOUS

## 2021-06-17 MED ORDER — ROCURONIUM BROMIDE 10 MG/ML (PF) SYRINGE
PREFILLED_SYRINGE | INTRAVENOUS | Status: AC
  Filled 2021-06-17: qty 10

## 2021-06-17 MED ORDER — ONDANSETRON HCL 4 MG/2ML IJ SOLN
INTRAMUSCULAR | Status: AC
  Filled 2021-06-17: qty 4

## 2021-06-17 MED ORDER — PHENYLEPHRINE HCL-NACL 20-0.9 MG/250ML-% IV SOLN
INTRAVENOUS | Status: DC | PRN
  Administered 2021-06-17: 25 ug/min via INTRAVENOUS

## 2021-06-17 MED ORDER — PROPOFOL 10 MG/ML IV BOLUS
INTRAVENOUS | Status: DC | PRN
  Administered 2021-06-17: 200 mg via INTRAVENOUS

## 2021-06-17 MED ORDER — ROSUVASTATIN CALCIUM 5 MG PO TABS: 5.0000 mg | ORAL_TABLET | Freq: Every day | ORAL | Status: DC

## 2021-06-17 MED ORDER — BUPIVACAINE HCL (PF) 0.5 % IJ SOLN
INTRAMUSCULAR | Status: DC | PRN
  Administered 2021-06-17: 5 mL

## 2021-06-17 MED ORDER — ONDANSETRON HCL 4 MG/2ML IJ SOLN: 4.0000 mg | Freq: Four times a day (QID) | INTRAMUSCULAR | Status: DC | PRN

## 2021-06-17 MED ORDER — ONDANSETRON HCL 4 MG PO TABS: 4.0000 mg | ORAL_TABLET | Freq: Four times a day (QID) | ORAL | Status: DC | PRN

## 2021-06-17 MED ORDER — CEFAZOLIN SODIUM-DEXTROSE 2-4 GM/100ML-% IV SOLN
2.0000 g | INTRAVENOUS | Status: AC
  Administered 2021-06-17: 2 g via INTRAVENOUS

## 2021-06-17 MED ORDER — ORAL CARE MOUTH RINSE: 15.0000 mL | Freq: Once | OROMUCOSAL | Status: DC

## 2021-06-17 MED ORDER — CEFAZOLIN SODIUM-DEXTROSE 2-4 GM/100ML-% IV SOLN
2.0000 g | Freq: Three times a day (TID) | INTRAVENOUS | Status: AC
  Administered 2021-06-18 (×2): 2 g via INTRAVENOUS
  Filled 2021-06-17 (×2): qty 100

## 2021-06-17 MED ORDER — ROCURONIUM BROMIDE 10 MG/ML (PF) SYRINGE
PREFILLED_SYRINGE | INTRAVENOUS | Status: AC
  Filled 2021-06-17: qty 20

## 2021-06-17 MED ORDER — 0.9 % SODIUM CHLORIDE (POUR BTL) OPTIME
TOPICAL | Status: DC | PRN
  Administered 2021-06-17: 1000 mL

## 2021-06-17 MED ORDER — HYDROMORPHONE HCL 1 MG/ML IJ SOLN
0.2500 mg | INTRAMUSCULAR | Status: DC | PRN
  Administered 2021-06-17 (×2): 0.5 mg via INTRAVENOUS

## 2021-06-17 MED ORDER — LISINOPRIL 20 MG PO TABS: 20.0000 mg | ORAL_TABLET | Freq: Every day | ORAL | Status: DC

## 2021-06-17 MED ORDER — FAMOTIDINE 20 MG PO TABS: 20.0000 mg | ORAL_TABLET | Freq: Every day | ORAL | Status: DC | PRN

## 2021-06-17 MED ORDER — SODIUM CHLORIDE 0.9% FLUSH: 3.0000 mL | Freq: Two times a day (BID) | INTRAVENOUS | Status: DC

## 2021-06-17 MED ORDER — MENTHOL 3 MG MT LOZG: 1.0000 | LOZENGE | OROMUCOSAL | Status: DC | PRN

## 2021-06-17 MED ORDER — AMISULPRIDE (ANTIEMETIC) 5 MG/2ML IV SOLN: 10.0000 mg | Freq: Once | INTRAVENOUS | Status: DC | PRN

## 2021-06-17 MED ORDER — MORPHINE SULFATE (PF) 2 MG/ML IV SOLN: 2.0000 mg | INTRAVENOUS | Status: DC | PRN

## 2021-06-17 MED ORDER — LIDOCAINE 4 % EX PTCH: 1.0000 | MEDICATED_PATCH | Freq: Every day | CUTANEOUS | Status: DC | PRN

## 2021-06-17 MED ORDER — HYDROCHLOROTHIAZIDE 25 MG PO TABS: 25.0000 mg | ORAL_TABLET | Freq: Every day | ORAL | Status: DC

## 2021-06-17 MED ORDER — PHENOL 1.4 % MT LIQD: 1.0000 | OROMUCOSAL | Status: DC | PRN

## 2021-06-17 MED ORDER — DIAZEPAM 5 MG PO TABS: 10.0000 mg | ORAL_TABLET | Freq: Three times a day (TID) | ORAL | Status: DC | PRN

## 2021-06-17 MED ORDER — CELECOXIB 200 MG PO CAPS
200.0000 mg | ORAL_CAPSULE | Freq: Every day | ORAL | Status: DC
  Administered 2021-06-18: 09:00:00 200 mg via ORAL
  Filled 2021-06-17: qty 1

## 2021-06-17 MED ORDER — SODIUM CHLORIDE 0.9% FLUSH: 3.0000 mL | INTRAVENOUS | Status: DC | PRN

## 2021-06-17 MED ORDER — PROMETHAZINE HCL 25 MG/ML IJ SOLN
6.2500 mg | INTRAMUSCULAR | Status: DC | PRN
Start: 2021-06-17 — End: 2021-06-17

## 2021-06-17 MED ORDER — THROMBIN 5000 UNITS EX SOLR
CUTANEOUS | Status: AC
  Filled 2021-06-17: qty 5000

## 2021-06-17 MED ORDER — UPADACITINIB ER 15 MG PO TB24: 15.0000 mg | ORAL_TABLET | Freq: Every day | ORAL | Status: DC

## 2021-06-17 MED ORDER — OXYCODONE HCL 5 MG PO TABS: 5.0000 mg | ORAL_TABLET | ORAL | Status: DC | PRN

## 2021-06-17 MED ORDER — ROCURONIUM BROMIDE 10 MG/ML (PF) SYRINGE
PREFILLED_SYRINGE | INTRAVENOUS | Status: DC | PRN
Start: 2021-06-17 — End: 2021-06-17
  Administered 2021-06-17: 100 mg via INTRAVENOUS
  Administered 2021-06-17: 20 mg via INTRAVENOUS

## 2021-06-17 MED ORDER — CEFAZOLIN SODIUM-DEXTROSE 2-4 GM/100ML-% IV SOLN
INTRAVENOUS | Status: AC
  Filled 2021-06-17: qty 100

## 2021-06-17 MED ORDER — ACETAMINOPHEN 325 MG PO TABS
650.0000 mg | ORAL_TABLET | ORAL | Status: DC | PRN
  Administered 2021-06-18: 09:00:00 650 mg via ORAL
  Filled 2021-06-17: qty 2

## 2021-06-17 MED ORDER — MIDAZOLAM HCL 2 MG/2ML IJ SOLN
INTRAMUSCULAR | Status: DC | PRN
  Administered 2021-06-17: 2 mg via INTRAVENOUS

## 2021-06-17 MED ORDER — DEXAMETHASONE SODIUM PHOSPHATE 10 MG/ML IJ SOLN
INTRAMUSCULAR | Status: DC | PRN
Start: 2021-06-17 — End: 2021-06-17
  Administered 2021-06-17: 10 mg via INTRAVENOUS

## 2021-06-17 MED ORDER — PHENYLEPHRINE HCL (PRESSORS) 10 MG/ML IV SOLN
INTRAVENOUS | Status: DC | PRN
Start: 2021-06-17 — End: 2021-06-17
  Administered 2021-06-17 (×2): 80 ug via INTRAVENOUS

## 2021-06-17 SURGICAL SUPPLY — 65 items
BAG COUNTER SPONGE SURGICOUNT (BAG) ×4 IMPLANT
BASKET BONE COLLECTION (BASKET) ×2 IMPLANT
BENZOIN TINCTURE PRP APPL 2/3 (GAUZE/BANDAGES/DRESSINGS) IMPLANT
BLADE CLIPPER SURG (BLADE) IMPLANT
BLADE SURG 11 STRL SS (BLADE) ×2 IMPLANT
BUR MATCHSTICK NEURO 3.0 LAGG (BURR) ×2 IMPLANT
BUR PRECISION FLUTE 5.0 (BURR) ×2 IMPLANT
CAGE EXP CATALYFT SHORT 9X22.5 (Cage) ×4 IMPLANT
CANISTER SUCT 3000ML PPV (MISCELLANEOUS) ×2 IMPLANT
CARTRIDGE OIL MAESTRO DRILL (MISCELLANEOUS) ×1 IMPLANT
CNTNR URN SCR LID CUP LEK RST (MISCELLANEOUS) ×1 IMPLANT
CONT SPEC 4OZ STRL OR WHT (MISCELLANEOUS) ×1
COVER BACK TABLE 60X90IN (DRAPES) ×2 IMPLANT
DECANTER SPIKE VIAL GLASS SM (MISCELLANEOUS) ×2 IMPLANT
DERMABOND ADVANCED (GAUZE/BANDAGES/DRESSINGS) ×1
DERMABOND ADVANCED .7 DNX12 (GAUZE/BANDAGES/DRESSINGS) ×1 IMPLANT
DIFFUSER DRILL AIR PNEUMATIC (MISCELLANEOUS) ×2 IMPLANT
DRAPE C-ARM 42X72 X-RAY (DRAPES) ×2 IMPLANT
DRAPE C-ARMOR (DRAPES) ×2 IMPLANT
DRAPE LAPAROTOMY 100X72X124 (DRAPES) ×2 IMPLANT
DRAPE SURG 17X23 STRL (DRAPES) ×2 IMPLANT
DRSG OPSITE POSTOP 4X6 (GAUZE/BANDAGES/DRESSINGS) ×2 IMPLANT
DURAPREP 26ML APPLICATOR (WOUND CARE) ×2 IMPLANT
ELECT REM PT RETURN 9FT ADLT (ELECTROSURGICAL) ×2
ELECTRODE REM PT RTRN 9FT ADLT (ELECTROSURGICAL) ×1 IMPLANT
GAUZE 4X4 16PLY ~~LOC~~+RFID DBL (SPONGE) ×2 IMPLANT
GAUZE SPONGE 4X4 12PLY STRL (GAUZE/BANDAGES/DRESSINGS) IMPLANT
GLOVE EXAM NITRILE XL STR (GLOVE) IMPLANT
GLOVE SURG ENC MOIS LTX SZ7.5 (GLOVE) IMPLANT
GLOVE SURG LTX SZ7 (GLOVE) ×4 IMPLANT
GLOVE SURG UNDER POLY LF SZ7.5 (GLOVE) ×4 IMPLANT
GOWN STRL REUS W/ TWL LRG LVL3 (GOWN DISPOSABLE) ×4 IMPLANT
GOWN STRL REUS W/ TWL XL LVL3 (GOWN DISPOSABLE) IMPLANT
GOWN STRL REUS W/TWL 2XL LVL3 (GOWN DISPOSABLE) IMPLANT
GOWN STRL REUS W/TWL LRG LVL3 (GOWN DISPOSABLE) ×4
GOWN STRL REUS W/TWL XL LVL3 (GOWN DISPOSABLE)
GRAFT BONE PROTEIOS XS 0.5CC (Orthopedic Implant) ×2 IMPLANT
HEMOSTAT POWDER KIT SURGIFOAM (HEMOSTASIS) ×2 IMPLANT
KIT BASIN OR (CUSTOM PROCEDURE TRAY) ×2 IMPLANT
KIT POSITION SURG JACKSON T1 (MISCELLANEOUS) ×2 IMPLANT
KIT TURNOVER KIT B (KITS) ×2 IMPLANT
MILL MEDIUM DISP (BLADE) ×2 IMPLANT
NEEDLE HYPO 18GX1.5 BLUNT FILL (NEEDLE) IMPLANT
NEEDLE HYPO 22GX1.5 SAFETY (NEEDLE) ×2 IMPLANT
NEEDLE SPNL 18GX3.5 QUINCKE PK (NEEDLE) ×2 IMPLANT
NS IRRIG 1000ML POUR BTL (IV SOLUTION) ×2 IMPLANT
OIL CARTRIDGE MAESTRO DRILL (MISCELLANEOUS) ×2
PACK LAMINECTOMY NEURO (CUSTOM PROCEDURE TRAY) ×2 IMPLANT
PAD ARMBOARD 7.5X6 YLW CONV (MISCELLANEOUS) ×6 IMPLANT
PUTTY GRAFTON DBF 6CC W/DELIVE (Putty) ×2 IMPLANT
ROD CC 30MM (Rod) ×4 IMPLANT
SCREW SET SOLERA (Screw) ×4 IMPLANT
SCREW SET SOLERA TI (Screw) ×4 IMPLANT
SCREW SOLERA 6.5X35 (Screw) ×8 IMPLANT
SPONGE SURGIFOAM ABS GEL 100 (HEMOSTASIS) IMPLANT
SPONGE T-LAP 4X18 ~~LOC~~+RFID (SPONGE) ×2 IMPLANT
STRIP CLOSURE SKIN 1/2X4 (GAUZE/BANDAGES/DRESSINGS) IMPLANT
SUT VIC AB 0 CT1 18XCR BRD8 (SUTURE) ×2 IMPLANT
SUT VIC AB 0 CT1 8-18 (SUTURE) ×2
SUT VICRYL 3-0 RB1 18 ABS (SUTURE) ×2 IMPLANT
SYR 3ML LL SCALE MARK (SYRINGE) ×6 IMPLANT
TOWEL GREEN STERILE (TOWEL DISPOSABLE) ×2 IMPLANT
TOWEL GREEN STERILE FF (TOWEL DISPOSABLE) ×2 IMPLANT
TRAY FOLEY MTR SLVR 16FR STAT (SET/KITS/TRAYS/PACK) ×2 IMPLANT
WATER STERILE IRR 1000ML POUR (IV SOLUTION) ×2 IMPLANT

## 2021-06-17 NOTE — Anesthesia Procedure Notes (Signed)
Procedure Name: Intubation Date/Time: 06/17/2021 2:52 PM Performed by: Clearnce Sorrel, CRNA Pre-anesthesia Checklist: Patient identified, Emergency Drugs available, Suction available and Patient being monitored Patient Re-evaluated:Patient Re-evaluated prior to induction Oxygen Delivery Method: Circle System Utilized Preoxygenation: Pre-oxygenation with 100% oxygen Induction Type: IV induction Ventilation: Mask ventilation without difficulty Laryngoscope Size: Mac and 3 Grade View: Grade III Tube type: Oral Tube size: 7.0 mm Number of attempts: 1 Airway Equipment and Method: Stylet and Oral airway Placement Confirmation: positive ETCO2 and breath sounds checked- equal and bilateral Secured at: 22 cm Tube secured with: Tape Dental Injury: Teeth and Oropharynx as per pre-operative assessment

## 2021-06-17 NOTE — H&P (Signed)
Chief Complaint   Back and leg pain  History of Present Illness  Lindsay Hansen is a 50 y.o. female initially seen in the outpatient neurosurgery clinic with back and bilateral leg pain consistent with neurogenic claudication.  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  She has attempted multiple conservative treatments in the past including physical therapy as well as epidural steroid injections.  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  We had a lengthy discussion about treatment options and she elected to proceed with surgical decompression and fusion.  Past Medical History   Past Medical History:  Diagnosis Date   Allergy    Anemia    past hx- none since menopause    Anxiety    Blood transfusion without reported diagnosis    GERD (gastroesophageal reflux disease)    Hyperlipidemia    Hypertension    Hypothyroidism    Myasthenia gravis    ocular   Psoriatic arthritis ([place])     Past Surgical History   Past Surgical History:  Procedure Laterality Date   ABDOMINOPLASTY     nicked blood vessel- hematoma- had to have surgery for the hematoma with blood transfusion    CESAREAN SECTION  05-15-2004 and 02-17-2006   CHOLECYSTECTOMY  11/2004   COLONOSCOPY     > 15 yrs ago- normal per pt    gastric sleeve  08/16/2011   THYMECTOMY  2014   Laparoscopic   UMBILICAL HERNIA REPAIR  08/16/2011    Social History   Social History   Tobacco Use   Smoking status: Never   Smokeless tobacco: Never  Vaping Use   Vaping Use: Never used  Substance Use Topics   Alcohol use: Not Currently    Comment: occ-  last use 11/2020   Drug use: No    Medications   Prior to Admission medications   Medication Sig Start Date End Date Taking? Authorizing Provider  acetaminophen (TYLENOL) 500 MG tablet Take 1,000 mg  by mouth every 6 (six) hours as needed for moderate pain.   Yes [provider]  celecoxib (CELEBREX) 200 MG capsule Take 200 mg by mouth See admin instructions. Take 200 mg daily, may take a second 200 mg dose as needed for pain 07/10/20  Yes [provider]  Cyanocobalamin (B-12 COMPLIANCE INJECTION) 1000 MCG/ML KIT Inject 1,000 mcg as directed every 30 (thirty) days.   Yes [provider]  diazepam (VALIUM) 10 MG tablet Take 10 mg by mouth 3 (three) times daily as needed for anxiety or sleep. 06/06/19  Yes [provider]  famotidine (PEPCID) 20 MG tablet Take 20 mg by mouth daily as needed for heartburn or indigestion.   Yes [provider]  hydrochlorothiazide (HYDRODIURIL) 25 MG tablet Take 25 mg by mouth daily. 05/03/20  Yes [provider]  lansoprazole (PREVACID) 30 MG capsule Take 30 mg by mouth daily.   Yes [provider]  levothyroxine (SYNTHROID) 112 MCG tablet Take 112 mcg by mouth daily. 07/10/20  Yes [provider]  Lidocaine 4 % PTCH Apply 1 patch topically daily as needed (pain).   Yes [provider]  lisinopril (ZESTRIL) 20 MG tablet Take 20 mg by mouth daily. 05/22/20  Yes [provider]  oxyCODONE-acetaminophen (PERCOCET/ROXICET) 5-325 MG tablet Take 1 tablet by mouth every 6 (six) hours as needed  for pain. 06/09/21  Yes [provider]  potassium chloride (MICRO-K) 10 MEQ CR capsule Take 10 mEq by mouth daily. 05/20/21  Yes [provider]  predniSONE (DELTASONE) 10 MG tablet Take 10 mg by mouth daily with breakfast. 08/10/11  Yes [provider]  progesterone (PROMETRIUM) 200 MG capsule Take 200 mg by mouth at bedtime. 07/10/20  Yes [provider]  rosuvastatin (CRESTOR) 5 MG tablet Take 5 mg by mouth daily. 05/16/20  Yes [provider]  sertraline (ZOLOFT) 100 MG tablet Take 150 mg by mouth daily. 1 1/2 tablets daily 06/26/20  Yes [provider]  Upadacitinib ER (RINVOQ) 15 MG TB24 Take 15 mg by mouth daily.   Yes [provider]  Vitamin D, Ergocalciferol, (DRISDOL) 1.25 MG (50000 UNIT) CAPS capsule Take 50,000 Units by mouth every Sunday. 07/17/20  Yes [provider]  VIVELLE-DOT 0.075 MG/24HR Place 1 patch onto the skin 2 (two) times a week. Changes on Mondays and Thursdays 07/09/20  Yes [provider]  clotrimazole-betamethasone (LOTRISONE) lotion Apply 1 application topically daily as needed (psoriasis). 10/06/18   [provider]  promethazine (PHENERGAN) 25 MG tablet Take 25 mg by mouth Three times daily as needed for nausea or vomiting. 08/11/11   [provider]    Allergies  No Known Allergies  Review of Systems  ROS  Neurologic Exam  Awake, alert, oriented Memory and concentration grossly intact Speech fluent, appropriate CN grossly intact Motor exam: Upper Extremities Deltoid Bicep Tricep Grip  Right 5/5 5/5 5/5 5/5  Left 5/5 5/5 5/5 5/5   Lower Extremities IP Quad PF DF EHL  Right 5/5 5/5 5/5 5/5 5/5  Left 5/5 5/5 5/5 5/5 5/5   Sensation grossly intact to LT  Imaging  MRI of the lumbar spine dated May 2022 was reviewed.  This demonstrates maintenance of lumbar lordosis.  There is ligamentous hypertrophy and significant bilateral facet arthropathy at L3-4 which contribute to moderate central stenosis as well as bilateral facet joint effusions with joint diastases.  Dynamic lumbar spine x-rays done on an outpatient basis also demonstrate mobile spondylolisthesis at L3-4.  Impression  - 50 y.o. 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  Patient has failed multiple different conservative treatments.  Plan  -We will plan on proceeding with surgical decompression and fusion at L3-4  I have reviewed the details of the surgery as well as the expected  postoperative course and recovery with the patient.  We have discussed the associated risks, benefits, and alternatives to surgery.  All her questions today were answered.  She provided informed consent to proceed.   Consuella Lose, MD [facility]

## 2021-06-17 NOTE — Transfer of Care (Signed)
Immediate Anesthesia Transfer of Care Note  Patient: Lindsay Hansen  Procedure(s) Performed: Posterior Lumbar Interbody Fusion Lumbar Three-Four  Patient Location: PACU  Anesthesia Type:General  Level of Consciousness: awake, alert  and oriented  Airway & Oxygen Therapy: Patient Spontanous Breathing  Post-op Assessment: Report given to RN and Post -op Vital signs reviewed and stable  Post vital signs: Reviewed and stable  Last Vitals:  Vitals Value Taken Time  BP 161/97 06/17/21 1916  Temp 36.7 C 06/17/21 1915  Pulse 96 06/17/21 1920  Resp 18 06/17/21 1920  SpO2 95 % 06/17/21 1920  Vitals shown include unvalidated device data.  Last Pain:  Vitals:   06/17/21 1915  TempSrc:   PainSc: 5       Patients Stated Pain Goal: 2 (03/79/55 8316)  Complications: No notable events documented.

## 2021-06-17 NOTE — Progress Notes (Signed)
Orthopedic Tech Progress Note Patient Details:  Lindsay Hansen May 19, 1971 [id number]  Ortho Devices Type of Ortho Device: Lumbar corsett Ortho Device/Splint Interventions: Ordered   Post Interventions Patient Tolerated: Other (comment) Instructions Provided: Other (comment)  Charline Bills Muaaz Brau 06/17/2021, 8:00 PM Delivered to RN at pacu 13

## 2021-06-18 DIAGNOSIS — M48062 Spinal stenosis, lumbar region with neurogenic claudication: Secondary | ICD-10-CM | POA: Diagnosis not present

## 2021-06-18 LAB — BASIC METABOLIC PANEL
Anion gap: 11 (ref 5–15)
BUN: 11 mg/dL (ref 6–20)
CO2: 21 mmol/L — ABNORMAL LOW (ref 22–32)
Calcium: 8.8 mg/dL — ABNORMAL LOW (ref 8.9–10.3)
Chloride: 102 mmol/L (ref 98–111)
Creatinine, Ser: 0.74 mg/dL (ref 0.44–1.00)
GFR, Estimated: >60 mL/min (ref >60)
Glucose, Bld: 116 mg/dL — ABNORMAL HIGH (ref 70–99)
Potassium: 4.1 mmol/L (ref 3.5–5.1)
Sodium: 134 mmol/L — ABNORMAL LOW (ref 135–145)

## 2021-06-18 LAB — CBC
HCT: 38.2 % (ref 36.0–46.0)
Hemoglobin: 12.2 g/dL (ref 12.0–15.0)
MCH: 27.7 pg (ref 26.0–34.0)
MCHC: 31.9 g/dL (ref 30.0–36.0)
MCV: 86.8 fL (ref 80.0–100.0)
Platelets: 344 10*3/uL (ref 150–400)
RBC: 4.4 MIL/uL (ref 3.87–5.11)
RDW: 15.5 % (ref 11.5–15.5)
WBC: 16.7 10*3/uL — ABNORMAL HIGH (ref 4.0–10.5)
nRBC: 0 % (ref 0.0–0.2)

## 2021-06-18 MED ORDER — OXYCODONE HCL 10 MG PO TABS
10.0000 mg | ORAL_TABLET | ORAL | 0 refills | Status: AC | PRN
Start: 2021-06-18 — End: 2021-06-25

## 2021-06-18 MED ORDER — METHOCARBAMOL 500 MG PO TABS
500.0000 mg | ORAL_TABLET | Freq: Three times a day (TID) | ORAL | 0 refills | Status: DC | PRN
Start: 2021-06-18 — End: 2024-06-16

## 2021-06-18 NOTE — Plan of Care (Signed)
Adequately Ready for discharge

## 2021-06-18 NOTE — Evaluation (Signed)
Physical Therapy Evaluation and Discharge Patient Details Name: Brookelynn Hamor MRN: [id number] DOB: 1971/06/06 Today's Date: 06/18/2021  History of Present Illness  Pt is a 50 y/o female with progressive back and leg pain. Imaging shows stenosis with mobile spondylolisthesis at L3-4. Pt elected to undergo surgical decompression and fusion at L3-4. PMH: anemia, GERD, anxiety, HTN, myasthenia gravis.   Clinical Impression  Patient evaluated by Physical Therapy with no further acute PT needs identified. All education has been completed and the patient has no further questions. Pt was able to demonstrate transfers and ambulation with gross modified independence and no AD. Pt was educated on precautions, brace application/wearing schedule, appropriate activity progression, and car transfer. See below for any follow-up Physical Therapy or equipment needs. PT is signing off. Thank you for this referral.        Recommendations for follow up therapy are one component of a multi-disciplinary discharge planning process, led by the attending physician.  Recommendations may be updated based on patient status, additional functional criteria and insurance authorization.  Follow Up Recommendations No PT follow up    Assistance Recommended at Discharge PRN  Functional Status Assessment Patient has had a recent decline in their functional status and demonstrates the ability to make significant improvements in function in a reasonable and predictable amount of time.  Equipment Recommendations       Recommendations for Other Services       Precautions / Restrictions Precautions Precautions: Back Precaution Booklet Issued: Yes (comment) Required Braces or Orthoses: Spinal Brace Spinal Brace: Lumbar corset;Applied in sitting position Restrictions Weight Bearing Restrictions: No      Mobility  Bed Mobility Overal bed mobility: Modified Independent             General bed mobility comments: Pt  was received sitting up EOB    Transfers Overall transfer level: Modified independent Equipment used: None Transfers: Sit to/from Stand             General transfer comment: No assist required    Ambulation/Gait Ambulation/Gait assistance: Modified independent (Device/Increase time) Gait Distance (Feet): 560 Feet Assistive device: None Gait Pattern/deviations: Step-through pattern;Decreased stride length Gait velocity: Decreased Gait velocity interpretation: >2.62 ft/sec, indicative of community ambulatory   General Gait Details: Mildly decreased gait speed however overall steady without overt LOB.  Stairs Stairs: Yes Stairs assistance: Supervision Stair Management: One rail Right;Step to pattern;Forwards Number of Stairs: 10 General stair comments: Light cues for safety however no assist required.  Wheelchair Mobility    Modified Rankin (Stroke Patients Only)       Balance Overall balance assessment: No apparent balance deficits (not formally assessed)                                           Pertinent Vitals/Pain Pain Assessment: Faces Faces Pain Scale: Hurts a little bit Pain Location: back Pain Descriptors / Indicators: Operative site guarding Pain Intervention(s): Limited activity within patient's tolerance;Monitored during session;Repositioned    Home Living Family/patient expects to be discharged to:: Private residence Living Arrangements: Spouse/significant other;Children (teenage children) Available Help at Discharge: Family;Available 24 hours/day (husband can work from home for few days) Type of Home: House Home Access: Stairs to enter Entrance Stairs-Rails: Right Entrance Stairs-Number of Steps: 2 Alternate Level Stairs-Number of Steps: flight Home Layout: Two level;Bed/bath upstairs;1/2 bath on main level Home Equipment: Adaptive equipment;Hand held shower head;Shower seat  Additional Comments: half bath downstairs, full  bath upstairs. recently ordered shower chair and reacher    Prior Function Prior Level of Function : Independent/Modified Independent;Working/employed;Driving             Mobility Comments: no use of AD for mobility ADLs Comments: Independent with ADLs, IADLs. 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 for a little bit)     Journalist, newspaper   Dominant Hand: Right    Extremity/Trunk Assessment   Upper Extremity Assessment Upper Extremity Assessment: Defer to OT evaluation    Lower Extremity Assessment Lower Extremity Assessment: Overall WFL for tasks assessed (mild weakness consistent with pre-op diagnosis however grossly WFL)    Cervical / Trunk Assessment Cervical / Trunk Assessment: Back Surgery  Communication   Communication: No difficulties  Cognition Arousal/Alertness: Awake/alert Behavior During Therapy: WFL for tasks assessed/performed Overall Cognitive Status: Within Functional Limits for tasks assessed                                          General Comments      Exercises     Assessment/Plan    PT Assessment Patient does not need any further PT services  PT Problem List         PT Treatment Interventions      PT Goals (Current goals can be found in the Care Plan section)  Acute Rehab PT Goals Patient Stated Goal: Home today PT Goal Formulation: All assessment and education complete, DC therapy    Frequency     Barriers to discharge        Co-evaluation               AM-PAC PT "6 Clicks" Mobility  Outcome Measure Help needed turning from your back to your side while in a flat bed without using bedrails?: None Help needed moving from lying on your back to sitting on the side of a flat bed without using bedrails?: None Help needed moving to and from a bed to a chair (including a wheelchair)?: None Help needed standing up from a chair using your arms (e.g., wheelchair or bedside chair)?:  None Help needed to walk in hospital room?: None Help needed climbing 3-5 steps with a railing? : None 6 Click Score: 24    End of Session Equipment Utilized During Treatment: Gait belt;Back brace Activity Tolerance: Patient tolerated treatment well Patient left: in bed;with call bell/phone within reach Nurse Communication: Mobility status PT Visit Diagnosis: Unsteadiness on feet (R26.81);Pain Pain - part of body:  (back)    Time: 4196-2229 PT Time Calculation (min) (ACUTE ONLY): 17 min   Charges:   PT Evaluation $PT Eval Low Complexity: 1 Low          Rolinda Roan, PT, DPT Acute Rehabilitation Services Pager: [phone] Office: [phone]   Thelma Comp 06/18/2021, 10:02 AM

## 2021-06-18 NOTE — Evaluation (Signed)
Occupational Therapy Evaluation/Discharge Patient Details Name: Lindsay Hansen MRN: [id number] DOB: May 06, 1971 Today's Date: 06/18/2021   History of Present Illness Pt is a 50 y/o female with progressive back and leg pain. Imaging shows stenosis with mobile spondylolisthesis at L3-4. Pt elected to undergo surgical decompression and fusion at L3-4. PMH: anemia, GERD, anxiety,, HTN, myasthenia gravis.   Clinical Impression   PTA, pt lives with spouse and teenage children. Pt reports complete independence and works as a Consulting civil engineer. Pt presents now s/p procedure above with minor deficits in pain. Educated on spinal precautions for ADLs with pt able to return demo all dressing tasks, brace mgmt and bathroom mobility without assist. Pt does report lower toilets at home - educated on use of BSC over toilet to increase ease of transfers and to promote optimal body mechanics. Educated on strategies for IADLs with pt demo good awareness of problem solving precautions. Pt also reports family can assist with IADLs as needed. Pt verbalized understanding of all education with no further skilled OT services needed at this time.       Recommendations for follow up therapy are one component of a multi-disciplinary discharge planning process, led by the attending physician.  Recommendations may be updated based on patient status, additional functional criteria and insurance authorization.   Follow Up Recommendations  No OT follow up    Assistance Recommended at Discharge PRN  Functional Status Assessment  Patient has had a recent decline in their functional status and demonstrates the ability to make significant improvements in function in a reasonable and predictable amount of time.  Equipment Recommendations  BSC/3in1 (bariatric BSC for placement over toilet)    Recommendations for Other Services       Precautions / Restrictions Precautions Precautions: Back Precaution Booklet Issued: Yes  (comment) Required Braces or Orthoses: Spinal Brace Spinal Brace: Lumbar corset;Applied in sitting position Restrictions Weight Bearing Restrictions: No      Mobility Bed Mobility Overal bed mobility: Modified Independent             General bed mobility comments: able to complete log roll well    Transfers Overall transfer level: Independent Equipment used: None                      Balance Overall balance assessment: No apparent balance deficits (not formally assessed)                                         ADL either performed or assessed with clinical judgement   ADL Overall ADL's : Modified independent                                       General ADL Comments: after education on spinal precautions, pt able to return demo dressing, brace mgmt, mobility to bathroom and toileting tasks without issue. Pt does report needing grab bar in hospital bathroom to stand - educated on use of BSC over toilet to ease transfers and allow armrests for pt to push from     Vision Baseline Vision/History: 0 No visual deficits Ability to See in Adequate Light: 0 Adequate Patient Visual Report: No change from baseline Vision Assessment?: No apparent visual deficits     Perception     Praxis  Pertinent Vitals/Pain Pain Assessment: Faces Faces Pain Scale: Hurts a little bit Pain Location: back Pain Descriptors / Indicators: Operative site guarding Pain Intervention(s): Monitored during session;Premedicated before session     Hand Dominance Right   Extremity/Trunk Assessment Upper Extremity Assessment Upper Extremity Assessment: Overall WFL for tasks assessed   Lower Extremity Assessment Lower Extremity Assessment: Defer to PT evaluation   Cervical / Trunk Assessment Cervical / Trunk Assessment: Back Surgery   Communication Communication Communication: No difficulties   Cognition Arousal/Alertness: Awake/alert Behavior  During Therapy: WFL for tasks assessed/performed Overall Cognitive Status: Within Functional Limits for tasks assessed                                       General Comments       Exercises     Shoulder Instructions      Home Living Family/patient expects to be discharged to:: Private residence Living Arrangements: Spouse/significant other;Children (teenage children) Available Help at Discharge: Family;Available 24 hours/day (husband can work from home for few days) Type of Home: House Home Access: Stairs to enter [employer] of Steps: 2 Entrance Stairs-Rails: Right Home Layout: Two level;Bed/bath upstairs;1/2 bath on main level Alternate Level Stairs-Number of Steps: flight Alternate Level Stairs-Rails: Right;Left Bathroom Shower/Tub: Occupational psychologist: Standard     Home Equipment: Adaptive equipment;Hand held shower head;Shower Theme park manager: Reacher Additional Comments: half bath downstairs, full bath upstairs. recently ordered shower chair and reacher      Prior Functioning/Environment Prior Level of Function : Independent/Modified Independent;Working/employed;Driving             Mobility Comments: no use of AD for mobility ADLs Comments: Independent with ADLs, IADLs. Works as Consulting civil engineer in [place] per chart (can work from home for a little bit)        OT Problem List:        OT Treatment/Interventions:      OT Goals(Current goals can be found in the care plan section) Acute Rehab OT Goals Patient Stated Goal: recover well, avoid any further back surgeries OT Goal Formulation: All assessment and education complete, DC therapy  OT Frequency:     Barriers to D/C:            Co-evaluation              AM-PAC OT "6 Clicks" Daily Activity     Outcome Measure Help from another person eating meals?: None Help from another person taking care of personal grooming?: None Help from  another person toileting, which includes using toliet, bedpan, or urinal?: None Help from another person bathing (including washing, rinsing, drying)?: None Help from another person to put on and taking off regular upper body clothing?: None Help from another person to put on and taking off regular lower body clothing?: None 6 Click Score: 24   End of Session Equipment Utilized During Treatment: Back brace Nurse Communication: Mobility status  Activity Tolerance: Patient tolerated treatment well Patient left: Other (comment) (preparing for mobility to bathroom)  OT Visit Diagnosis: Pain Pain - part of body:  (back)                Time: 7209-4709 OT Time Calculation (min): 22 min Charges:  OT General Charges $OT Visit: 1 Visit OT Evaluation $OT Eval Low Complexity: [place], OTR/L Acute Rehab Services Office: [phone]   Almyra Free  Esau Fridman 06/18/2021, 8:34 AM

## 2021-06-18 NOTE — Discharge Summary (Signed)
Physician Discharge Summary  Patient ID: Lindsay Hansen MRN: [id number] DOB/AGE: 1970-09-28 50 y.o.  Admit date: 06/17/2021 Discharge date: 06/18/2021  Admission Diagnoses: Lumbar Spondylolisthesis L3-4  Discharge Diagnoses: Same Active Problems:   Spondylolisthesis at L3-L4 level   Discharged Condition: Stable  Hospital Course:  Lindsay Hansen is a 50 y.o. female who was admitted for elective L3-4 PLIF. On POD#1 the patient was at her neurologic baseline, reporting relief of her leg pain. Back pain was controlled with oral medication, she was ambulating without difficulty, voiding normally, and tolerating diet.  Treatments: Surgery - PLIF L3-4  Discharge Exam: Blood pressure (!) 103/58, pulse 100, temperature (!) 97.5 F (36.4 C), temperature source Oral, resp. rate 17, height 5' 5.5" (1.664 m), weight 135.2 kg, SpO2 97 %. Awake, alert, oriented Speech fluent, appropriate CN grossly intact 5/5 BUE/BLE Wound c/d/i  Follow-up: Follow-up in my office [facility] [phone]) in 2-3 weeks  Disposition: Discharge disposition: 01-Home or Self Care       Discharge Instructions     Call MD for:  redness, tenderness, or signs of infection (pain, swelling, redness, odor or green/yellow discharge around incision site)   Complete by: As directed    Call MD for:  temperature >100.4   Complete by: As directed    Diet - low sodium heart healthy   Complete by: As directed    Discharge instructions   Complete by: As directed    Walk at home as much as possible, at least 4 times / day   Increase activity slowly   Complete by: As directed    Lifting restrictions   Complete by: As directed    No lifting > 10 lbs   May shower / Bathe   Complete by: As directed    48 hours after surgery   May walk up steps   Complete by: As directed    Other Restrictions   Complete by: As directed    No bending/twisting at waist   Remove dressing in 48 hours    Complete by: As directed       Allergies as of 06/18/2021   No Known Allergies      Medication List     STOP taking these medications    oxyCODONE-acetaminophen 5-325 MG tablet Commonly known as: PERCOCET/ROXICET       TAKE these medications    acetaminophen 500 MG tablet Commonly known as: TYLENOL Take 1,000 mg by mouth every 6 (six) hours as needed for moderate pain.   B-12 Compliance Injection 1000 MCG/ML Kit Generic drug: Cyanocobalamin Inject 1,000 mcg as directed every 30 (thirty) days.   celecoxib 200 MG capsule Commonly known as: CELEBREX Take 200 mg by mouth See admin instructions. Take 200 mg daily, may take a second 200 mg dose as needed for pain   clotrimazole-betamethasone lotion Commonly known as: LOTRISONE Apply 1 application topically daily as needed (psoriasis).   diazepam 10 MG tablet Commonly known as: VALIUM Take 10 mg by mouth 3 (three) times daily as needed for anxiety or sleep.   famotidine 20 MG tablet Commonly known as: PEPCID Take 20 mg by mouth daily as needed for heartburn or indigestion.   hydrochlorothiazide 25 MG tablet Commonly known as: HYDRODIURIL Take 25 mg by mouth daily.   lansoprazole 30 MG capsule Commonly known as: PREVACID Take 30 mg by mouth daily.   levothyroxine 112 MCG tablet Commonly known as: SYNTHROID Take 112 mcg by mouth daily.   Lidocaine 4 %  Ptch Apply 1 patch topically daily as needed (pain).   lisinopril 20 MG tablet Commonly known as: ZESTRIL Take 20 mg by mouth daily.   methocarbamol 500 MG tablet Commonly known as: ROBAXIN Take 1 tablet (500 mg total) by mouth every 8 (eight) hours as needed for muscle spasms.   Oxycodone HCl 10 MG Tabs Take 1 tablet (10 mg total) by mouth every 4 (four) hours as needed for up to 7 days for severe pain ((score 7 to 10)).   potassium chloride 10 MEQ CR capsule Commonly known as: MICRO-K Take 10 mEq by mouth daily.   predniSONE 10 MG tablet Commonly  known as: DELTASONE Take 10 mg by mouth daily with breakfast.   progesterone 200 MG capsule Commonly known as: PROMETRIUM Take 200 mg by mouth at bedtime.   promethazine 25 MG tablet Commonly known as: PHENERGAN Take 25 mg by mouth Three times daily as needed for nausea or vomiting.   Rinvoq 15 MG Tb24 Generic drug: Upadacitinib ER Take 15 mg by mouth daily.   rosuvastatin 5 MG tablet Commonly known as: CRESTOR Take 5 mg by mouth daily.   sertraline 100 MG tablet Commonly known as: ZOLOFT Take 150 mg by mouth daily. 1 1/2 tablets daily   Vitamin D (Ergocalciferol) 1.25 MG (50000 UNIT) Caps capsule Commonly known as: DRISDOL Take 50,000 Units by mouth every Sunday.   Vivelle-Dot 0.075 MG/24HR Generic drug: estradiol Place 1 patch onto the skin 2 (two) times a week. Changes on Mondays and Thursdays               Durable Medical Equipment  (From admission, onward)           Start     Ordered   06/18/21 0822  For home use only DME 3 n 1  Once       Comments: Patient Needs a Bariatric Commode   06/18/21 0824            Follow-up Information     , , MD Follow up.   Specialty: Neurosurgery Contact information: [address] [place] [address] [phone]                 Signed:  C  06/18/2021, 1:28 PM     

## 2021-06-18 NOTE — Progress Notes (Signed)
OT Cancellation Note  Patient Details Name: Lindsay Hansen MRN: [id number] DOB: Jul 12, 1971   Cancelled Treatment:    Reason Eval/Treat Not Completed: Other (comment) Pt currently with breakfast. Will follow-up for OT eval  Layla Maw 06/18/2021, 6:56 AM

## 2021-06-18 NOTE — Progress Notes (Signed)
Patient alert and oriented, voiding adequately, skin clean, dry and intact without evidence of skin break down, or symptoms of complications - no redness or edema noted, only slight tenderness at site.  Patient states pain is manageable at time of discharge. Patient has an appointment with MD in 3 weeks 

## 2021-06-19 NOTE — Op Note (Signed)
NEUROSURGERY OPERATIVE NOTE   PREOP DIAGNOSIS:  1. Spondylolisthesis L3-4  POSTOP DIAGNOSIS: Same  PROCEDURE: 1. L3-4 laminectomy with facetectomy for decompression of exiting nerve roots, more than would be required for placement of interbody graft 2. Placement of anterior interbody device - [employer] expandable 25mm lordotic cage x2 3. Posterior non-segmental instrumentation using cortical pedicle screws at L3 - L4 4. Interbody arthrodesis, L3-4 5. Use of locally harvested bone autograft 6. Use of non-structural bone allograft - DBM, ProteiOS  SURGEON: Dr. Consuella Lose, MD  ASSISTANT: Dr. Duffy Rhody, MD  ANESTHESIA: General Endotracheal  EBL: 325cc  SPECIMENS: None  DRAINS: None  COMPLICATIONS: None immediate  CONDITION: Hemodynamically stable to PACU  HISTORY: Lindsay Hansen is a 50 y.o. female who has been followed in the outpatient clinic with back and leg pain related to stenosis with mobile spondylolisthesis at L3-4.  Multiple conservative treatments were attempted without significant improvement and we ultimately elected to proceed with surgical decompression and fusion. Risks, benefits, and alternative treatments were reviewed in detail in the office. After all questions were answered, informed consent was obtained and witnessed.  PROCEDURE IN DETAIL: The patient was brought to the operating room via stretcher. After induction of general anesthesia, the patient was positioned on the operative table in the prone position. All pressure points were meticulously padded. Incision was then marked out and prepped and draped in the usual sterile fashion.  After timeout was conducted, midline skin was infiltrated with local anesthetic.  Spinal needle was then introduced to and identify the surface projection of the L3-4 interspace.  Skin incision was then made sharply and Bovie electrocautery was used to dissect the subcutaneous tissue until the lumbodorsal fascia  was identified and incised. The muscle was then elevated in the subperiosteal plane and the L3 lamina and L3-4 facet complexes were identified. Self-retaining retractors were then placed. Lateral fluoroscopy was taken with a dissector in the L3-4 interspace to confirm our location.  At this point attention was turned to decompression. Complete L3 laminectomy was completed with a high-speed drill and Kerrison punches.  Normal dura was identified.  A ball-tipped dissector was then used to identify the foramina bilaterally.  High-speed drill was used to cut across the pars interarticularis and the inferior articulating process of L3 was removed bilaterally.  I did note significant joint diastases with a fair bit of joint fluid upon removal of the IAP of L3.  Kerrison punches were then used to remove the medial and superior portion of the SAP of L4.  Thickened ligament was removed from the L3-4 foramen bilaterally in order to fully decompress the exiting L3 nerve roots.  I was then able to easily pass a ball dissector in the ventral epidural space and underneath the L3 and L4 nerves indicating good decompression.   Disc space was then identified, incised bilaterally, and using a combination of shavers, curettes and rongeurs, complete discectomy was completed. Endplates were prepared with curettes and rasps, and bone harvested during decompression was mixed with DBM and ProeiOS and packed into the interspace. A 75mm lordotic cage was tapped into place bilaterally. Cages were expanded to achieve good endplate apposition.  Good position was confirmed with fluoroscopy.  At this point, the entry points for bilateral L3 and L4 cortical pedicle screws were identified using standard anatomic landmarks and lateral fluoro. Pilot holes were then drilled and tapped to 5.5 x 39mm. Screws were then placed in L3 and L4 measuring 6.5 x 83mm. Prebent lordotic rod was then  sized and placed into the pedicle screws. Set screws were  placed and final tightened. Final AP and lateral fluoroscopic images confirmed good position.  Hemostasis was secured and confirmed with bipolar cautery and morcellized gelfoam with thrombin. The wound was then irrigated with copious amounts of antibiotic saline, then closed in standard fashion using a combination of interrupted 0 and 3-0 Vicryl stitches in the muscular, fascial, and subcutaneous layers. Skin was then closed using standard Dermabond. Sterile dressing was then applied. The patient was then transferred to the stretcher, extubated, and taken to the postanesthesia care unit in stable hemodynamic condition.  At the end of the case all sponge, needle, cottonoid, and instrument counts were correct.   Consuella Lose, MD [facility]

## 2021-06-22 NOTE — Anesthesia Postprocedure Evaluation (Signed)
Anesthesia Post Note  Patient: Elfa Wooton  Procedure(s) Performed: Posterior Lumbar Interbody Fusion Lumbar Three-Four     Patient location during evaluation: PACU Anesthesia Type: General Level of consciousness: awake and alert Pain management: pain level controlled Vital Signs Assessment: post-procedure vital signs reviewed and stable Respiratory status: spontaneous breathing, nonlabored ventilation, respiratory function stable and patient connected to nasal cannula oxygen Cardiovascular status: blood pressure returned to baseline and stable Postop Assessment: no apparent nausea or vomiting Anesthetic complications: no   No notable events documented.  Last Vitals:  Vitals:   06/18/21 0328 06/18/21 0728  BP: (!) 138/93 (!) 103/58  Pulse: 99 100  Resp: 18 17  Temp: (!) 36.3 C (!) 36.4 C  SpO2: 97% 97%    Last Pain:  Vitals:   06/18/21 1447  TempSrc:   PainSc: [place]

## 2021-08-02 HISTORY — PX: MICRODISCECTOMY LUMBAR: SUR864

## 2022-01-24 IMAGING — RF DG LUMBAR SPINE 2-3V
1 series · 2 of 2 positions shown · non-contrast
Comparison: None.

CLINICAL DATA: INTRAOPERATIVE FLUOROSCOPIC IMAGING FOR LUMBAR
FUSION.

EXAM:
OPERATIVE LUMBAR SPINE 2 VIEWS
TECHNIQUE: Fluoroscopic spot image(s) were submitted for interpretation
post-operatively.

[Series 1: run · 2 of 2 slices shown]
[im 1/2]
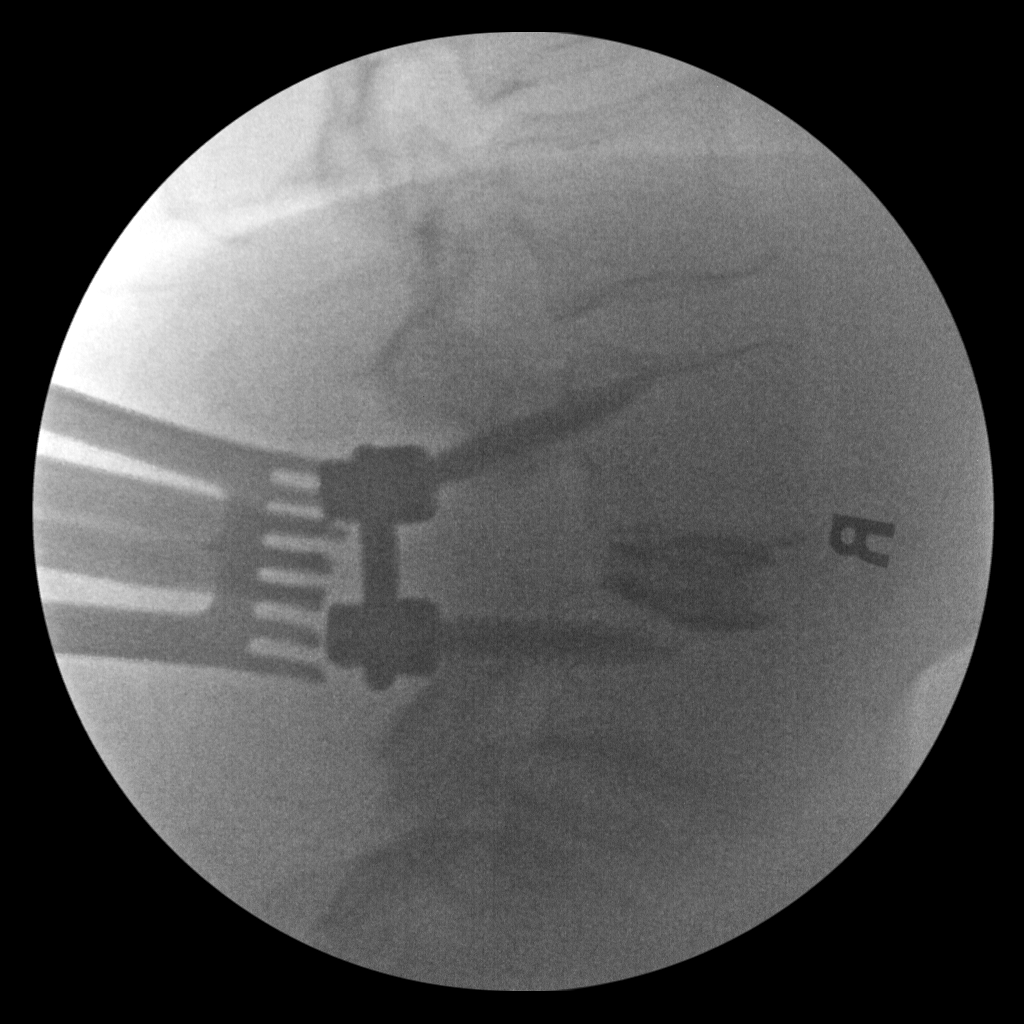
[im 2/2]
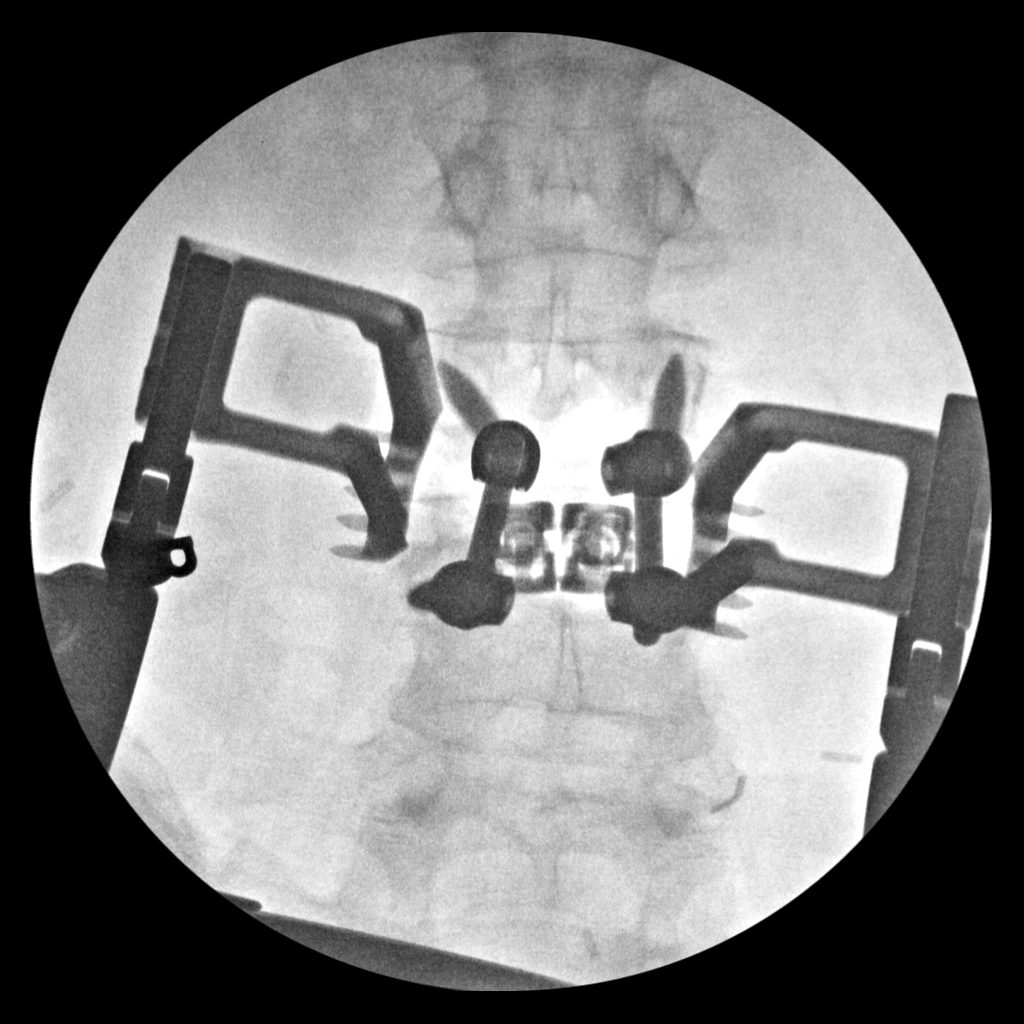

[2 of 2 positions shown; findings below may reference images not displayed]

FINDINGS: Two submitted images show bilateral pedicle screws and
interconnecting rods fusing L3-L4. Intervertebral cages are well
centered maintaining the L3-L4 disc interspace height.
IMPRESSION: Operative imaging provided for L3-L4 posterior lumbar fusion.

## 2022-02-26 ENCOUNTER — Other Ambulatory Visit: Payer: Self-pay | Admitting: Specialist

## 2022-02-26 DIAGNOSIS — M1711 Unilateral primary osteoarthritis, right knee: Secondary | ICD-10-CM

## 2022-03-06 ENCOUNTER — Ambulatory Visit
Admission: RE | Admit: 2022-03-06 | Discharge: 2022-03-06 | Disposition: A | Discharge status: Home / Self Care | Payer: Managed Care, Other (non HMO) | Source: Ambulatory Visit | Attending: Specialist | Admitting: Specialist

## 2022-03-06 DIAGNOSIS — M1711 Unilateral primary osteoarthritis, right knee: Secondary | ICD-10-CM

## 2022-04-23 NOTE — Progress Notes (Signed)
Sent message, via epic in basket, requesting orders in epic from surgeon.  

## 2022-04-23 NOTE — H&P (Signed)
Lindsay Hansen is an 51 y.o. female.   Chief Complaint: right knee pain HPI: Patient is a pleasant 51 year old female who came to the office after an injury to her right knee in April of 2023. After the injury she had immediate right knee pain. She got an MRI done that showed a medial meniscus tear.   Past Medical History:  Diagnosis Date   Allergy    Anemia    past hx- none since menopause    Anxiety    Blood transfusion without reported diagnosis    GERD (gastroesophageal reflux disease)    Hyperlipidemia    Hypertension    Hypothyroidism    Myasthenia gravis    ocular   Psoriatic arthritis (HCC)     Past Surgical History:  Procedure Laterality Date   ABDOMINOPLASTY     nicked blood vessel- hematoma- had to have surgery for the hematoma with blood transfusion    CESAREAN SECTION  05-15-2004 and 02-17-2006   CHOLECYSTECTOMY  11/2004   COLONOSCOPY     > 15 yrs ago- normal per pt    gastric sleeve  08/16/2011   THYMECTOMY  2014   Laparoscopic   UMBILICAL HERNIA REPAIR  08/16/2011    Family History  Problem Relation Age of Onset   Asthma Mother    Heart disease Mother    Heart disease Father    Prostate cancer Father    Colon cancer Neg Hx    Colon polyps Neg Hx    Esophageal cancer Neg Hx    Rectal cancer Neg Hx    Stomach cancer Neg Hx    Social History:  reports that she has never smoked. She has never used smokeless tobacco. She reports that she does not currently use alcohol. She reports that she does not use drugs.  Allergies: No Known Allergies  No medications prior to admission.    No results found for this or any previous visit (from the past 48 hour(s)). No results found.  Review of Systems  All other systems reviewed and are negative.   There were no vitals taken for this visit. Physical Exam Musculoskeletal:     Comments: Examination of the right knee reveals normal contours and landmarks. No significant periarticular swelling. Trace  effusion. She can extend it fully. Flexion is to about 100 degrees with the edge of the chair. She has diffuse medial knee pain. There is medial joint line pain and medial joint pain. Little bit of tenderness over the medial patellofemoral region.       Assessment/Plan Right knee medial meniscus tear: Patient has a right knee medial meniscus tear. Surgical vs nonsurgical management discuss with the patient in the office. Risks and benefits of surgery also discussed with the patient. She has elected to proceed with surgical management at this time. She will be having a right knee arthroscopy partial medial meniscectomy.    Drue Novel, PA 04/23/2022, 1:15 PM

## 2022-04-26 NOTE — Patient Instructions (Signed)
DUE TO COVID-19 ONLY TWO VISITORS  (aged 51 and older)  ARE ALLOWED TO COME WITH YOU AND STAY IN THE WAITING ROOM ONLY DURING PRE OP AND PROCEDURE.   **NO VISITORS ARE ALLOWED IN THE SHORT STAY AREA OR RECOVERY ROOM!!**  IF YOU WILL BE ADMITTED INTO THE HOSPITAL YOU ARE ALLOWED ONLY FOUR SUPPORT PEOPLE DURING VISITATION HOURS ONLY (7 AM -8PM)   The support person(s) must pass our screening, gel in and out, and wear a mask at all times, including in the patient's room. Patients must also wear a mask when staff or their support person are in the room. Visitors GUEST BADGE MUST BE WORN VISIBLY  One adult visitor may remain with you overnight and MUST be in the room by 8 P.M.     Your procedure is scheduled on: 05/06/22   Report to [facility] Main Entrance    Report to admitting at 11:30 AM   Call this number if you have problems the morning of surgery [phone]   Do not eat food :After Midnight.   After Midnight you may have the following liquids until __10:30____ AM/  DAY OF SURGERY  Water Black Coffee (sugar ok, NO MILK/CREAM OR CREAMERS)  Tea (sugar ok, NO MILK/CREAM OR CREAMERS) regular and decaf                             Plain Jell-O (NO RED)                                           Fruit ices (not with fruit pulp, NO RED)                                     Popsicles (NO RED)                                                                  Juice: apple, WHITE grape, WHITE cranberry Sports drinks like Gatorade (NO RED)                    The day of surgery:  Drink ONE (1) Pre-Surgery Clear Ensure  at 10:15 AM the morning of surgery. Drink in one sitting. Do not sip.  This drink was given to you during your hospital  pre-op appointment visit. Nothing else to drink after completing the  Pre-Surgery Clear Ensure at 10:30 AM          If you have questions, please contact your surgeon's office.       Oral Hygiene is also important to reduce your risk of  infection.                                    Remember - BRUSH YOUR TEETH THE MORNING OF SURGERY WITH YOUR REGULAR TOOTHPASTE   Do NOT smoke after Midnight   Take these medicines the morning of surgery with A SIP OF WATER: Sertraline- Zoloft  Rosuvastatin- Crestor,                                                                                                                              Prednisone                                                                                                                              Levothyroxine- Synthroid                                                                                                                              Lansoprazole- Prevacid   Bring CPAP mask and tubing day of surgery.                              You may not have any metal on your body including hair pins, jewelry, and body piercing             Do not wear make-up, lotions, powders, perfumes/cologne, or deodorant  Do not wear nail polish including gel and S&S, artificial/acrylic nails, or any other type of covering on natural nails including finger and toenails. If you have artificial nails, gel coating, etc. that needs to be removed by a nail salon please have this removed prior to surgery or surgery may need to be canceled/ delayed if the surgeon/ anesthesia feels like they are unable to be safely monitored.   Do not shave  48 hours prior to surgery.    Do not bring valuables to the hospital. [place].   Contacts, dentures or bridgework may not be worn into surgery.   DO NOT [place].     Patients discharged on  the day of surgery will not be allowed to drive home.  Someone NEEDS to stay with you for the first 24 hours after anesthesia.    Special Instructions:  Bring a copy of your healthcare power of attorney and living will documents the day of surgery if you haven't scanned them before.              Please read over the following fact sheets you were given: IF YOU HAVE QUESTIONS ABOUT YOUR PRE-OP INSTRUCTIONS PLEASE CALL [phone]     [facility] - Preparing for Surgery Before surgery, you can play an important role.  Because skin is not sterile, your skin needs to be as free of germs as possible.  You can reduce the number of germs on your skin by washing with CHG (chlorahexidine gluconate) soap before surgery.  CHG is an antiseptic cleaner which kills germs and bonds with the skin to continue killing germs even after washing. Please DO NOT use if you have an allergy to CHG or antibacterial soaps.  If your skin becomes reddened/irritated stop using the CHG and inform your nurse when you arrive at Short Stay. Do not shave (including legs and underarms) for at least 48 hours prior to the first CHG shower.  . Please follow these instructions carefully:  1.  Shower with CHG Soap the night before surgery and the  morning of Surgery.  2.  If you choose to wash your hair, wash your hair first as usual with your  normal  shampoo.  3.  After you shampoo, rinse your hair and body thoroughly to remove the  shampoo.                            4.  Use CHG as you would any other liquid soap.  You can apply chg directly  to the skin and wash                       Gently with a scrungie or clean washcloth.  5.  Apply the CHG Soap to your body ONLY FROM THE NECK DOWN.   Do not use on face/ open                           Wound or open sores. Avoid contact with eyes, ears mouth and genitals (private parts).                       Wash face,  Genitals (private parts) with your normal soap.             6.  Wash thoroughly, paying special attention to the area where your surgery  will be performed.  7.  Thoroughly rinse your body with warm water from the neck down.  8.   DO NOT shower/wash with your normal soap after using and rinsing off  the CHG Soap.             9.  Pat yourself dry with a clean towel.            10.  Wear clean pajamas.            11.  Place clean sheets on your bed the night of your first shower and do not  sleep with pets. Day of Surgery : Do not apply any lotions/deodorants the morning of surgery.  Please  wear clean clothes to the hospital/surgery center.  FAILURE TO FOLLOW THESE INSTRUCTIONS MAY RESULT IN THE CANCELLATION OF YOUR SURGERY   ________________________________________________________________________   Incentive Spirometer  An incentive spirometer is a tool that can help keep your lungs clear and active. This tool measures how well you are filling your lungs with each breath. Taking long deep breaths may help reverse or decrease the chance of developing breathing (pulmonary) problems (especially infection) following: A long period of time when you are unable to move or be active. BEFORE THE PROCEDURE  If the spirometer includes an indicator to show your best effort, your nurse or respiratory therapist will set it to a desired goal. If possible, sit up straight or lean slightly forward. Try not to slouch. Hold the incentive spirometer in an upright position. INSTRUCTIONS FOR USE  Sit on the edge of your bed if possible, or sit up as far as you can in bed or on a chair. Hold the incentive spirometer in an upright position. Breathe out normally. Place the mouthpiece in your mouth and seal your lips tightly around it. Breathe in slowly and as deeply as possible, raising the piston or the ball toward the top of the column. Hold your breath for 3-5 seconds or for as long as possible. Allow the piston or ball to fall to the bottom of the column. Remove the mouthpiece from your mouth and breathe out normally. Rest for a few seconds and repeat Steps 1 through 7 at least 10 times every 1-2 hours when you are awake. Take your time  and take a few normal breaths between deep breaths. The spirometer may include an indicator to show your best effort. Use the indicator as a goal to work toward during each repetition. After each set of 10 deep breaths, practice coughing to be sure your lungs are clear. If you have an incision (the cut made at the time of surgery), support your incision when coughing by placing a pillow or rolled up towels firmly against it. Once you are able to get out of bed, walk around indoors and cough well. You may stop using the incentive spirometer when instructed by your caregiver.  RISKS AND COMPLICATIONS Take your time so you do not get dizzy or light-headed. If you are in pain, you may need to take or ask for pain medication before doing incentive spirometry. It is harder to take a deep breath if you are having pain. AFTER USE Rest and breathe slowly and easily. It can be helpful to keep track of a log of your progress. Your caregiver can provide you with a simple table to help with this. If you are using the spirometer at home, follow these instructions: [place] IF:  You are having difficultly using the spirometer. You have trouble using the spirometer as often as instructed. Your pain medication is not giving enough relief while using the spirometer. You develop fever of 100.5 F (38.1 C) or higher. SEEK IMMEDIATE MEDICAL CARE IF:  You cough up bloody sputum that had not been present before. You develop fever of 102 F (38.9 C) or greater. You develop worsening pain at or near the incision site. MAKE SURE YOU:  Understand these instructions. Will watch your condition. Will get help right away if you are not doing well or get worse. Document Released: 11/29/2006 Document Revised: 10/11/2011 Document Reviewed: 01/30/2007 [facility] Patient Information 2014 [place], [place].   ________________________________________________________________________

## 2022-04-30 ENCOUNTER — Encounter (HOSPITAL_COMMUNITY): Payer: Self-pay

## 2022-04-30 ENCOUNTER — Other Ambulatory Visit: Payer: Self-pay

## 2022-04-30 ENCOUNTER — Encounter (HOSPITAL_COMMUNITY)
Admission: RE | Admit: 2022-04-30 | Discharge: 2022-04-30 | Disposition: A | Discharge status: Home / Self Care | Payer: Managed Care, Other (non HMO) | Source: Ambulatory Visit | Attending: Specialist

## 2022-04-30 DIAGNOSIS — Z01812 Encounter for preprocedural laboratory examination: Secondary | ICD-10-CM | POA: Insufficient documentation

## 2022-04-30 DIAGNOSIS — Y939 Activity, unspecified: Secondary | ICD-10-CM | POA: Insufficient documentation

## 2022-04-30 DIAGNOSIS — L4050 Arthropathic psoriasis, unspecified: Secondary | ICD-10-CM | POA: Diagnosis not present

## 2022-04-30 DIAGNOSIS — I10 Essential (primary) hypertension: Secondary | ICD-10-CM | POA: Diagnosis not present

## 2022-04-30 DIAGNOSIS — S83241A Other tear of medial meniscus, current injury, right knee, initial encounter: Secondary | ICD-10-CM | POA: Diagnosis not present

## 2022-04-30 DIAGNOSIS — G7000 Myasthenia gravis without (acute) exacerbation: Secondary | ICD-10-CM | POA: Diagnosis not present

## 2022-04-30 DIAGNOSIS — Z01818 Encounter for other preprocedural examination: Secondary | ICD-10-CM

## 2022-04-30 DIAGNOSIS — Y929 Unspecified place or not applicable: Secondary | ICD-10-CM | POA: Diagnosis not present

## 2022-04-30 LAB — CBC
HCT: 35.6 % — ABNORMAL LOW (ref 36.0–46.0)
Hemoglobin: 11 g/dL — ABNORMAL LOW (ref 12.0–15.0)
MCH: 27.3 pg (ref 26.0–34.0)
MCHC: 30.9 g/dL (ref 30.0–36.0)
MCV: 88.3 fL (ref 80.0–100.0)
Platelets: 316 10*3/uL (ref 150–400)
RBC: 4.03 MIL/uL (ref 3.87–5.11)
RDW: 16.3 % — ABNORMAL HIGH (ref 11.5–15.5)
WBC: 8.3 10*3/uL (ref 4.0–10.5)
nRBC: 0 % (ref 0.0–0.2)

## 2022-04-30 LAB — BASIC METABOLIC PANEL
Anion gap: 7 (ref 5–15)
BUN: 17 mg/dL (ref 6–20)
CO2: 27 mmol/L (ref 22–32)
Calcium: 9.4 mg/dL (ref 8.9–10.3)
Chloride: 104 mmol/L (ref 98–111)
Creatinine, Ser: 0.77 mg/dL (ref 0.44–1.00)
GFR, Estimated: >60 mL/min (ref >60)
Glucose, Bld: 112 mg/dL — ABNORMAL HIGH (ref 70–99)
Potassium: 4.1 mmol/L (ref 3.5–5.1)
Sodium: 138 mmol/L (ref 135–145)

## 2022-04-30 NOTE — Progress Notes (Signed)
Anesthesia note:  Bowel prep reminder:NA  PCP - Dr. Terence Lux Cardiologist -no Other-   Chest x-ray - no EKG - 06/16/21-epic Stress Test - no ECHO - 08/31/11-epic Cardiac Cath - NA  Pacemaker/ICD device last checked:NA  Sleep Study - no CPAP -   Pt is pre diabetic-NA Fasting Blood Sugar -  Checks Blood Sugar _____  Blood Thinner:NA Blood Thinner Instructions: Aspirin Instructions: Last Dose:  Anesthesia review: yes  Patient denies shortness of breath, fever, cough and chest pain at PAT appointment Pt reports no SOB walking or doing ADLs. She has ocular myasthenia gravis in remission. Her BMI is 50.8. Patient verbalized understanding of instructions that were given to them at the PAT appointment. Patient was also instructed that they will need to review over the PAT instructions again at home before surgery. yes

## 2022-05-03 NOTE — Progress Notes (Signed)
Anesthesia Chart Review   Case: [id number] Date/Time: 05/06/22 1315   Procedures:      KNEE ARTHROSCOPY WITH PARTIAL MEDIAL MENISECTOMy (Right: Knee) - knee block     KNEE ARTHROSCOPY CHONDROPLASTY (Right) - knee block   Anesthesia type: General   Pre-op diagnosis: Right knee medial meniscus tear, chondral flaps   Location: [facility] ROOM 09 / [facility]   Surgeons: Sydnee Cabal, MD       DISCUSSION:51 y.o. never smoker with h/o HTN, Myasthenia Gravis, Psoriatic arthritis on 52m prednisone daily, right knee medial meniscus tear scheduled for above procedure 05/06/2022 with Dr. RSydnee Cabal   [facility].  Her symptoms are predominantly ocular bulbar.  Last seen 04/08/2022.  Per note, "Impression:Mackayla Corthren Wheeler is a 51y.o. female with seropositive myasthenia gravis presenting for followup. She appears to be in either complete remission or pharmacologically induced complete remission"  S/p gastric sleeve.   S/p lumbar fusion with hardware in place L3-4.   Anticipate pt can proceed with planned procedure barring acute status change.   VS: BP (!) 141/94   Pulse (!) 56   Temp 36.9 C (Oral)   Resp 18   Ht 5' 5.5" (1.664 m)   Wt (!) 140.6 kg   SpO2 96%   BMI 50.80 kg/m   PROVIDERS: GRaina Mina, MD is PCP    LABS: Labs reviewed: Acceptable for surgery. (all labs ordered are listed, but only abnormal results are displayed)  Labs Reviewed  BASIC METABOLIC PANEL - Abnormal; Notable for the following components:      Result Value   Glucose, Bld 112 (*)    All other components within normal limits  CBC - Abnormal; Notable for the following components:   Hemoglobin 11.0 (*)    HCT 35.6 (*)    RDW 16.3 (*)    All other components within normal limits     IMAGES:   EKG:   CV:  Past Medical History:  Diagnosis Date   Allergy    Anemia    past hx- none since menopause    Anxiety    Blood transfusion  without reported diagnosis    GERD (gastroesophageal reflux disease)    Hyperlipidemia    Hypertension    Hypothyroidism    Myasthenia gravis    ocular   [place]     Past Surgical History:  Procedure Laterality Date   ABDOMINOPLASTY  2016   nicked blood vessel- hematoma- had to have surgery for the hematoma with blood transfusion    BACK SURGERY  2022   L3-L4 decompression and fusion   CESAREAN SECTION  05-15-2004 and 02-17-2006   CHOLECYSTECTOMY  11/2004   COLONOSCOPY     > 15 yrs ago- normal per pt    gastric sleeve  08/16/2011   LASIK Bilateral 2004   THYMECTOMY  2014   Laparoscopic   UMBILICAL HERNIA REPAIR  08/16/2011    MEDICATIONS:  acetaminophen (TYLENOL) 500 MG tablet   celecoxib (CELEBREX) 200 MG capsule   clotrimazole-betamethasone (LOTRISONE) lotion   Cyanocobalamin (B-12 COMPLIANCE INJECTION) 1000 MCG/ML KIT   diazepam (VALIUM) 10 MG tablet   DULoxetine (CYMBALTA) 60 MG capsule   famotidine (PEPCID) 20 MG tablet   hydrochlorothiazide (HYDRODIURIL) 25 MG tablet   lansoprazole (PREVACID) 30 MG capsule   levothyroxine (SYNTHROID) 125 MCG tablet   lisinopril (ZESTRIL) 20 MG tablet   loratadine (CLARITIN) 10 MG tablet   methocarbamol (ROBAXIN) 500  MG tablet   oxyCODONE-acetaminophen (PERCOCET/ROXICET) 5-325 MG tablet   potassium chloride (MICRO-K) 10 MEQ CR capsule   predniSONE (DELTASONE) 10 MG tablet   progesterone (PROMETRIUM) 200 MG capsule   rosuvastatin (CRESTOR) 10 MG tablet   sertraline (ZOLOFT) 100 MG tablet   Upadacitinib ER (RINVOQ) 15 MG TB24   Vitamin D, Ergocalciferol, (DRISDOL) 1.25 MG (50000 UNIT) CAPS capsule   VIVELLE-DOT 0.075 MG/24HR   No current facility-administered medications for this encounter.     Konrad Felix Ward, PA-C [facility] [phone]

## 2022-05-03 NOTE — Anesthesia Preprocedure Evaluation (Addendum)
Anesthesia Evaluation  Patient identified by MRN, date of birth, ID band Patient awake    Reviewed: Allergy & Precautions, NPO status , Patient's Chart, lab work & pertinent test results  History of Anesthesia Complications Negative for: history of anesthetic complications  Airway Mallampati: II  TM Distance: >3 FB Neck ROM: Full    Dental  (+) Teeth Intact, Dental Advisory Given   Pulmonary neg pulmonary ROS,    breath sounds clear to auscultation       Cardiovascular hypertension, Pt. on medications (-) angina Rhythm:Regular Rate:Normal     Neuro/Psych Anxiety MS Ocular myasthenia: resolved completely with Thymectomy, no longer on treatment    GI/Hepatic Neg liver ROS, GERD  Medicated and Controlled,  Endo/Other  Hypothyroidism Morbid obesityBMI 50.8  Renal/GU negative Renal ROS     Musculoskeletal  (+) Arthritis ,   Abdominal (+) + obese,   Peds  Hematology negative hematology ROS (+)   Anesthesia Other Findings   Reproductive/Obstetrics Post menopausal                           Anesthesia Physical Anesthesia Plan  ASA: 3  Anesthesia Plan: General   Post-op Pain Management: Tylenol PO (pre-op)* and Regional block*   Induction: Intravenous  PONV Risk Score and Plan: 3 and Ondansetron, Dexamethasone, Treatment may vary due to age or medical condition and Scopolamine patch - Pre-op  Airway Management Planned: LMA  Additional Equipment: None  Intra-op Plan:   Post-operative Plan:   Informed Consent: I have reviewed the patients History and Physical, chart, labs and discussed the procedure including the risks, benefits and alternatives for the proposed anesthesia with the patient or authorized representative who has indicated his/her understanding and acceptance.     Dental advisory given  Plan Discussed with: CRNA and Surgeon  Anesthesia Plan Comments: (See PAT note  04/30/2022 Plan GA with adductor canal and knee blocks for analgesia)      Anesthesia Quick Evaluation

## 2022-05-05 ENCOUNTER — Encounter (HOSPITAL_COMMUNITY): Payer: Self-pay

## 2022-05-05 ENCOUNTER — Emergency Department (HOSPITAL_COMMUNITY): Payer: Managed Care, Other (non HMO)

## 2022-05-05 ENCOUNTER — Other Ambulatory Visit: Payer: Self-pay

## 2022-05-05 ENCOUNTER — Emergency Department (HOSPITAL_COMMUNITY)
Admission: EM | Admit: 2022-05-05 | Discharge: 2022-05-05 | Disposition: A | Discharge status: Home / Self Care | Payer: Managed Care, Other (non HMO) | Attending: Emergency Medicine | Admitting: Emergency Medicine

## 2022-05-05 DIAGNOSIS — M5116 Intervertebral disc disorders with radiculopathy, lumbar region: Secondary | ICD-10-CM | POA: Insufficient documentation

## 2022-05-05 DIAGNOSIS — R202 Paresthesia of skin: Secondary | ICD-10-CM | POA: Diagnosis not present

## 2022-05-05 DIAGNOSIS — M545 Low back pain, unspecified: Secondary | ICD-10-CM | POA: Diagnosis present

## 2022-05-05 LAB — CBC WITH DIFFERENTIAL/PLATELET
Abs Immature Granulocytes: 0.08 10*3/uL — ABNORMAL HIGH (ref 0.00–0.07)
Basophils Absolute: 0 10*3/uL (ref 0.0–0.1)
Basophils Relative: 0 %
Eosinophils Absolute: 0 10*3/uL (ref 0.0–0.5)
Eosinophils Relative: 0 %
HCT: 38.3 % (ref 36.0–46.0)
Hemoglobin: 12.3 g/dL (ref 12.0–15.0)
Immature Granulocytes: 1 %
Lymphocytes Relative: 9 %
Lymphs Abs: 0.9 10*3/uL (ref 0.7–4.0)
MCH: 27.6 pg (ref 26.0–34.0)
MCHC: 32.1 g/dL (ref 30.0–36.0)
MCV: 86.1 fL (ref 80.0–100.0)
Monocytes Absolute: 0.7 10*3/uL (ref 0.1–1.0)
Monocytes Relative: 6 %
Neutro Abs: 8.6 10*3/uL — ABNORMAL HIGH (ref 1.7–7.7)
Neutrophils Relative %: 84 %
Platelets: 305 10*3/uL (ref 150–400)
RBC: 4.45 MIL/uL (ref 3.87–5.11)
RDW: 16.6 % — ABNORMAL HIGH (ref 11.5–15.5)
WBC: 10.3 10*3/uL (ref 4.0–10.5)
nRBC: 0 % (ref 0.0–0.2)

## 2022-05-05 LAB — URINALYSIS, ROUTINE W REFLEX MICROSCOPIC
Glucose, UA: NEGATIVE mg/dL
Hgb urine dipstick: NEGATIVE
Ketones, ur: NEGATIVE mg/dL
Nitrite: NEGATIVE
Protein, ur: NEGATIVE mg/dL
Specific Gravity, Urine: 1.036 — ABNORMAL HIGH (ref 1.005–1.030)
pH: 5 (ref 5.0–8.0)

## 2022-05-05 LAB — BASIC METABOLIC PANEL
Anion gap: 14 (ref 5–15)
BUN: 14 mg/dL (ref 6–20)
CO2: 26 mmol/L (ref 22–32)
Calcium: 10.3 mg/dL (ref 8.9–10.3)
Chloride: 99 mmol/L (ref 98–111)
Creatinine, Ser: 0.91 mg/dL (ref 0.44–1.00)
GFR, Estimated: >60 mL/min (ref >60)
Glucose, Bld: 104 mg/dL — ABNORMAL HIGH (ref 70–99)
Potassium: 3.1 mmol/L — ABNORMAL LOW (ref 3.5–5.1)
Sodium: 139 mmol/L (ref 135–145)

## 2022-05-05 MED ORDER — ONDANSETRON HCL 4 MG/2ML IJ SOLN
4.0000 mg | Freq: Once | INTRAMUSCULAR | Status: AC
  Administered 2022-05-05: 4 mg via INTRAVENOUS
  Filled 2022-05-05: qty 2

## 2022-05-05 MED ORDER — HYDROMORPHONE HCL 1 MG/ML IJ SOLN
1.0000 mg | Freq: Once | INTRAMUSCULAR | Status: AC
  Administered 2022-05-05: 1 mg via INTRAVENOUS
  Filled 2022-05-05: qty 1

## 2022-05-05 MED ORDER — OXYCODONE-ACETAMINOPHEN 5-325 MG PO TABS: ORAL_TABLET | ORAL | 0 refills | Status: DC

## 2022-05-05 MED ORDER — GADOPICLENOL 0.5 MMOL/ML IV SOLN
10.0000 mL | Freq: Once | INTRAVENOUS | Status: AC | PRN
  Administered 2022-05-05: 10 mL via INTRAVENOUS

## 2022-05-05 NOTE — ED Notes (Signed)
Patient transported to MRI 

## 2022-05-05 NOTE — ED Provider Notes (Addendum)
[facility] EMERGENCY DEPARTMENT Provider Note   CSN: [id number] Arrival date & time: 05/05/22  0551     History  No chief complaint on file.   Lindsay Hansen is a 51 y.o. female.  Pt complains of low back pain and pain going down her left leg,  Pt reports she has a burning pain down the outside of her thigh.  Pt reports she was unable to sleep last pm due to pain.  Pt took 2 percocet without relief.  Pt reports difficulty getting into her vehicle.  Pt reports numbness and tingling in left groin area.  Pt complains of a cold sensation groin area.  Pt has a history of back surgery done by Dr. Kathyrn Sheriff.  11/22.  Pt reports this discomfort reminds her of the pain she had before surgery    The history is provided by the patient. No language interpreter was used.       Home Medications Prior to Admission medications   Medication Sig Start Date End Date Taking? Authorizing Provider  oxyCODONE-acetaminophen (PERCOCET) 5-325 MG tablet 2 tablets every 4 hours as needed 05/05/22  Yes Fransico Meadow, PA-C  acetaminophen (TYLENOL) 500 MG tablet Take 1,000 mg by mouth every 8 (eight) hours as needed for moderate pain.    [provider]  celecoxib (CELEBREX) 200 MG capsule Take 200 mg by mouth daily. 07/10/20   [provider]  clotrimazole-betamethasone (LOTRISONE) lotion Apply 1 application topically daily as needed (psoriasis). 10/06/18   [provider]  Cyanocobalamin (B-12 COMPLIANCE INJECTION) 1000 MCG/ML KIT Inject 1,000 mcg as directed every 30 (thirty) days.    [provider]  diazepam (VALIUM) 10 MG tablet Take 10 mg by mouth 3 (three) times daily as needed for anxiety or sleep. 06/06/19   [provider]  DULoxetine (CYMBALTA) 60 MG capsule Take 60 mg by mouth daily.    [provider]  famotidine (PEPCID) 20 MG tablet Take 20 mg by mouth daily as needed for heartburn or indigestion.    [provider]   hydrochlorothiazide (HYDRODIURIL) 25 MG tablet Take 25 mg by mouth daily. 05/03/20   [provider]  lansoprazole (PREVACID) 30 MG capsule Take 30 mg by mouth daily.    [provider]  levothyroxine (SYNTHROID) 125 MCG tablet Take 125 mcg by mouth daily. 07/10/20   [provider]  lisinopril (ZESTRIL) 20 MG tablet Take 20 mg by mouth daily. 05/22/20   [provider]  loratadine (CLARITIN) 10 MG tablet Take 10 mg by mouth daily as needed for allergies.    [provider]  methocarbamol (ROBAXIN) 500 MG tablet Take 1 tablet (500 mg total) by mouth every 8 (eight) hours as needed for muscle spasms. 06/18/21   Consuella Lose, MD  oxyCODONE-acetaminophen (PERCOCET/ROXICET) 5-325 MG tablet Take 1 tablet by mouth every 6 (six) hours as needed for severe pain. 02/26/22   [provider]  potassium chloride (MICRO-K) 10 MEQ CR capsule Take 10 mEq by mouth daily. 05/20/21   [provider]  predniSONE (DELTASONE) 10 MG tablet Take 10 mg by mouth daily with breakfast. 08/10/11   [provider]  progesterone (PROMETRIUM) 200 MG capsule Take 200 mg by mouth at bedtime. 07/10/20   [provider]  rosuvastatin (CRESTOR) 10 MG tablet Take 10 mg by mouth daily. 05/16/20   [provider]  sertraline (ZOLOFT) 100 MG tablet Take 150 mg by mouth daily. 1 1/2 tablets daily 06/26/20  [provider]  Upadacitinib ER (RINVOQ) 15 MG TB24 Take 15 mg by mouth daily.    [provider]  Vitamin D, Ergocalciferol, (DRISDOL) 1.25 MG (50000 UNIT) CAPS capsule Take 50,000 Units by mouth every Sunday. 07/17/20   [provider]  VIVELLE-DOT 0.075 MG/24HR Place 1 patch onto the skin 2 (two) times a week. Changes on Mondays and Thursdays 07/09/20   [provider]      Allergies    Patient has no known allergies.    Review of Systems   Review of Systems  All other systems reviewed and are  negative.   Physical Exam Updated Vital Signs BP 107/78   Pulse 97   Temp 98.6 F (37 C) (Oral)   Resp 18   SpO2 94%  Physical Exam Vitals and nursing note reviewed.  Constitutional:      Appearance: She is well-developed.  HENT:     Head: Normocephalic.     Nose: Nose normal.     Mouth/Throat:     Mouth: Mucous membranes are moist.  Eyes:     Pupils: Pupils are equal, round, and reactive to light.  Cardiovascular:     Rate and Rhythm: Normal rate.  Pulmonary:     Effort: Pulmonary effort is normal.  Abdominal:     General: There is no distension.  Musculoskeletal:        General: Normal range of motion.     Cervical back: Normal range of motion.     Right lower leg: No edema.     Left lower leg: No edema.  Skin:    General: Skin is warm.  Neurological:     General: No focal deficit present.     Mental Status: She is alert and oriented to person, place, and time.     Cranial Nerves: No cranial nerve deficit.     Sensory: No sensory deficit.     Motor: No weakness.     Gait: Gait abnormal.     Comments: Difficulty ambulating second to knee pain  Psychiatric:        Mood and Affect: Mood normal.     ED Results / Procedures / Treatments   Labs (all labs ordered are listed, but only abnormal results are displayed) Labs Reviewed  CBC WITH DIFFERENTIAL/PLATELET - Abnormal; Notable for the following components:      Result Value   RDW 16.6 (*)    Neutro Abs 8.6 (*)    Abs Immature Granulocytes 0.08 (*)    All other components within normal limits  BASIC METABOLIC PANEL - Abnormal; Notable for the following components:   Potassium 3.1 (*)    Glucose, Bld 104 (*)    All other components within normal limits  URINALYSIS, ROUTINE W REFLEX MICROSCOPIC - Abnormal; Notable for the following components:   APPearance HAZY (*)    Specific Gravity, Urine 1.036 (*)    Bilirubin Urine MODERATE (*)    Leukocytes,Ua MODERATE (*)    Bacteria, UA RARE (*)    All other  components within normal limits    EKG None  Radiology MR Lumbar Spine W Wo Contrast  Result Date: 05/05/2022 CLINICAL DATA:  Low back pain; cauda equina syndrome suspected. EXAM: MRI LUMBAR SPINE WITHOUT AND WITH CONTRAST TECHNIQUE: Multiplanar and multiecho pulse sequences of the lumbar spine were obtained without and with intravenous contrast. COMPARISON:  MRI of the lumbar spine Dec 17, 2020. FINDINGS: Segmentation:  Standard. Alignment:  Physiologic. Vertebrae: No fracture, evidence  of discitis, or bone lesion. Interval postsurgical changes from L3-4 interbody fusion, posterior decompression and posterior transpedicular fixation. Conus medullaris and cauda equina: Conus extends to the T12-L1 level. Conus and cauda equina appear normal. Paraspinal and other soft tissues: Negative. Disc levels: T12-L1: No spinal canal or neural foraminal stenosis. L1-2: No spinal canal or neural foraminal stenosis. L2-3: Mild facet degenerative changes. No spinal canal or neural foraminal stenosis. L3-4: Interval postsurgical changes from decompression and fusion with interval resolution of spinal canal stenosis. No significant neural foraminal narrowing. L4-5: Interval progression of left central to subarticular superiorly migrating disc extrusion, hypertrophic facet degenerative changes with left sided joint effusion and bilateral ligamentum flavum redundancy. Findings result in moderate spinal canal stenosis with moderate right and severe left subarticular zone stenosis, and severe left neural foraminal narrowing. L5-S1: Small left central disc protrusion and mild-to-moderate facet degenerative changes without significant spinal canal or neural foraminal stenosis. The thecal sac is nearly effaced at this level by epidural lipomatosis. IMPRESSION: 1. Interval progression of left central to subarticular superiorly migrating disc extrusion at L4-5 resulting in moderate spinal canal stenosis with moderate right and severe  left subarticular zone stenosis, and severe left neural foraminal narrowing. 2. Interval postsurgical changes at L3-4 with resolution of spinal canal stenosis. Electronically Signed   By: Pedro Earls M.D.   On: 05/05/2022 11:24   CT Renal Stone Study  Result Date: 05/05/2022 CLINICAL DATA:  51 year old female with history of low back pain, leg pain and flank pain. EXAM: CT ABDOMEN AND PELVIS WITHOUT CONTRAST CT LUMBAR SPINE WITHOUT CONTRAST TECHNIQUE: Multidetector CT imaging of the abdomen and pelvis was performed following the standard protocol without IV contrast. Dedicated multiplanar reconstructions were also generated through the lumbar spine for interpretation purposes. RADIATION DOSE REDUCTION: This exam was performed according to the departmental dose-optimization program which includes automated exposure control, adjustment of the mA and/or kV according to patient size and/or use of iterative reconstruction technique. COMPARISON:  No priors. FINDINGS: Lower chest: Small to moderate hiatal hernia. Mild linear scarring in the right lower lobe and inferior segment of the lingula. Hepatobiliary: No definite suspicious cystic or solid hepatic lesions are confidently identified on today's noncontrast CT examination. Status post cholecystectomy. Pancreas: No definite pancreatic mass or peripancreatic fluid collections or inflammatory changes are noted on today's noncontrast CT examination. Spleen: Unremarkable. Adrenals/Urinary Tract: There are no abnormal calcifications within the collecting system of either kidney, along the course of either ureter, or within the lumen of the urinary bladder. No hydroureteronephrosis or perinephric stranding to suggest urinary tract obstruction at this time. The unenhanced appearance of the kidneys is unremarkable bilaterally. Urinary bladder is completely decompressed, but otherwise unremarkable in appearance. Bilateral adrenal glands are normal in  appearance. 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 No pathologic dilatation of small bowel or colon. Normal appendix. Vascular/Lymphatic: Minimal atherosclerotic calcifications are noted in the abdominal aorta. No lymphadenopathy noted in the abdomen or pelvis. Reproductive: Uterus and right ovary are unremarkable in appearance. In the left adnexa there is a 3.7 x 2.9 cm well-defined low-attenuation lesion, likely to represent a small cyst (axial image 84 of series 3). Other: No significant volume of ascites.  No pneumoperitoneum. Musculoskeletal: There are no aggressive appearing lytic or blastic lesions noted in the visualized portions of the skeleton. Status post PLIF at L3-L4 with interbody cage at the L3-L4 interspace. 4 mm of anterolisthesis of L3 upon L4. Alignment is otherwise anatomic in the lumbar spine. IMPRESSION:  1. No definite acute findings to account for the patient's symptoms. Specifically, no urinary tract calculi no findings of urinary tract obstruction are noted at this time. 2. No acute abnormality of the lumbar spine. Status post PLIF at L3-L4, as above. 3. 3.7 x 2.9 cm low-attenuation lesion in the left adnexa appears to represent a simple appearing ovarian cyst, but is not adequately characterized. Follow-up transvaginal ultrasound is suggested in the near future to better evaluate this lesion and exclude underlying neoplasm. This recommendation follows ACR consensus guidelines: White Paper of the ACR Incidental Findings Committee II on Adnexal Findings. J Am Coll Radiol [phone]. 4. Small to moderate-sized hiatal hernia. 5. Additional incidental findings, as above. Electronically Signed   By: Vinnie Langton M.D.   On: 05/05/2022 06:45   CT L-SPINE NO CHARGE  Result Date: 05/05/2022 CLINICAL DATA:  51 year old female with history of low back pain, leg pain and flank pain. EXAM: CT ABDOMEN AND PELVIS WITHOUT CONTRAST CT LUMBAR SPINE WITHOUT CONTRAST  TECHNIQUE: Multidetector CT imaging of the abdomen and pelvis was performed following the standard protocol without IV contrast. Dedicated multiplanar reconstructions were also generated through the lumbar spine for interpretation purposes. RADIATION DOSE REDUCTION: This exam was performed according to the departmental dose-optimization program which includes automated exposure control, adjustment of the mA and/or kV according to patient size and/or use of iterative reconstruction technique. COMPARISON:  No priors. FINDINGS: Lower chest: Small to moderate hiatal hernia. Mild linear scarring in the right lower lobe and inferior segment of the lingula. Hepatobiliary: No definite suspicious cystic or solid hepatic lesions are confidently identified on today's noncontrast CT examination. Status post cholecystectomy. Pancreas: No definite pancreatic mass or peripancreatic fluid collections or inflammatory changes are noted on today's noncontrast CT examination. Spleen: Unremarkable. Adrenals/Urinary Tract: There are no abnormal calcifications within the collecting system of either kidney, along the course of either ureter, or within the lumen of the urinary bladder. No hydroureteronephrosis or perinephric stranding to suggest urinary tract obstruction at this time. The unenhanced appearance of the kidneys is unremarkable bilaterally. Urinary bladder is completely decompressed, but otherwise unremarkable in appearance. Bilateral adrenal glands are normal in appearance. 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 No pathologic dilatation of small bowel or colon. Normal appendix. Vascular/Lymphatic: Minimal atherosclerotic calcifications are noted in the abdominal aorta. No lymphadenopathy noted in the abdomen or pelvis. Reproductive: Uterus and right ovary are unremarkable in appearance. In the left adnexa there is a 3.7 x 2.9 cm well-defined low-attenuation lesion, likely to represent a small  cyst (axial image 84 of series 3). Other: No significant volume of ascites.  No pneumoperitoneum. Musculoskeletal: There are no aggressive appearing lytic or blastic lesions noted in the visualized portions of the skeleton. Status post PLIF at L3-L4 with interbody cage at the L3-L4 interspace. 4 mm of anterolisthesis of L3 upon L4. Alignment is otherwise anatomic in the lumbar spine. IMPRESSION: 1. No definite acute findings to account for the patient's symptoms. Specifically, no urinary tract calculi no findings of urinary tract obstruction are noted at this time. 2. No acute abnormality of the lumbar spine. Status post PLIF at L3-L4, as above. 3. 3.7 x 2.9 cm low-attenuation lesion in the left adnexa appears to represent a simple appearing ovarian cyst, but is not adequately characterized. Follow-up transvaginal ultrasound is suggested in the near future to better evaluate this lesion and exclude underlying neoplasm. This recommendation follows ACR consensus guidelines: White Paper of the ACR Incidental Findings Committee II on Adnexal  Findings. J Am Coll Radiol [phone]. 4. Small to moderate-sized hiatal hernia. 5. Additional incidental findings, as above. Electronically Signed   By: Vinnie Langton M.D.   On: 05/05/2022 06:45    Procedures Procedures    Medications Ordered in ED Medications  gadopiclenol (VUEWAY) 0.5 MMOL/ML solution 10 mL (10 mLs Intravenous Contrast Given 05/05/22 1054)  HYDROmorphone (DILAUDID) injection 1 mg (1 mg Intravenous Given 05/05/22 1324)  ondansetron (ZOFRAN) injection 4 mg (4 mg Intravenous Given 05/05/22 1324)    ED Course/ Medical Decision Making/ A&P                           Medical Decision Making Amount and/or Complexity of Data Reviewed External Data Reviewed: notes.    Details: Neurosurgery notes reviewed Labs: ordered. Decision-making details documented in ED Course.    Details: Labs ordered reviewed and interpreted.  Potassium is 3.1.    Radiology: ordered and independent interpretation performed. Decision-making details documented in ED Course.    Details: Ct shows a left ovarian cyst.  (Pt aware she has)   MRI shows increased changes l4-L5 disc  Discussion of management or test interpretation with external provider(s): I spoke to Dr. Kathyrn Sheriff who reviewed pt's MRi.  He advised to have pt follow up with him/call him if needed.  He reports pt can have knee surgery tomorrow without concern from her back.  Pt given rx for percocet   Risk Prescription drug management. Risk Details: Dr. Armandina Gemma in to see and examine pt.  Pt counseled on results            Final Clinical Impression(s) / ED Diagnoses Final diagnoses:  Acute left-sided low back pain, unspecified whether sciatica present  Radiculopathy due to lumbar intervertebral disc disorder    Rx / DC Orders ED Discharge Orders          Ordered    oxyCODONE-acetaminophen (PERCOCET) 5-325 MG tablet        05/05/22 1318              Sidney Ace 05/05/22 1514    Regan Lemming, MD 05/05/22 1915    Fransico Meadow, PA-C 05/07/22 0935    Regan Lemming, MD 05/10/22 2311

## 2022-05-05 NOTE — ED Triage Notes (Signed)
Patient complains of lower back pain with radiation down left leg since Monday. No new injury, hx of back surgery. Reports this am she did have episode of urinating on herself.

## 2022-05-05 NOTE — Discharge Instructions (Addendum)
Return if any problems.

## 2022-05-05 NOTE — ED Provider Triage Note (Signed)
  Emergency Medicine Provider Triage Evaluation Note  MRN:  [id number]  Arrival date & time: 05/05/22    Medically screening exam initiated at 6:04 AM.   CC:   Low back pain  HPI:  Lindsay Hansen is a 51 y.o. year-old female presents to the ED with chief complaint of low back pain with radiating pain down leg.  Report having an episode of urinary incontinence.  Associated paraesthesias.  Pain is worsened with movement and ambulation.  Hx of spinal fusion in November.  Of note, patient is IM Physician.  History provided by patient. ROS:  -As included in HPI PE:   Vitals:   05/05/22 0559  BP: (!) 159/97  Pulse: (!) 104  Resp: 16  Temp: 97.7 F (36.5 C)  SpO2: 100%    Uncomfortable appearing No respiratory distress  MDM:  Based on signs and symptoms, lumbar disc pathology/radiculopathy is highest on my differential. I've ordered labs and imaging in triage to expedite lab/diagnostic workup.  Patient was informed that the remainder of the evaluation will be completed by another provider, this initial triage assessment does not replace that evaluation, and the importance of remaining in the ED until their evaluation is complete.    Montine Circle, PA-C 05/05/22 [phone]

## 2022-05-06 ENCOUNTER — Encounter (HOSPITAL_COMMUNITY): Payer: Self-pay | Admitting: Specialist

## 2022-05-06 ENCOUNTER — Ambulatory Visit (HOSPITAL_COMMUNITY)
Admission: RE | Admit: 2022-05-06 | Discharge: 2022-05-06 | Disposition: A | Discharge status: Home / Self Care | Payer: Managed Care, Other (non HMO) | Attending: Specialist | Admitting: Specialist

## 2022-05-06 ENCOUNTER — Ambulatory Visit (HOSPITAL_COMMUNITY): Payer: Managed Care, Other (non HMO) | Admitting: Physician Assistant

## 2022-05-06 ENCOUNTER — Ambulatory Visit (HOSPITAL_COMMUNITY): Payer: Managed Care, Other (non HMO) | Admitting: Anesthesiology

## 2022-05-06 ENCOUNTER — Encounter (HOSPITAL_COMMUNITY)
Admission: RE | Disposition: A | Discharge status: Home / Self Care | Payer: Self-pay | Source: Home / Self Care | Attending: Specialist

## 2022-05-06 DIAGNOSIS — X58XXXA Exposure to other specified factors, initial encounter: Secondary | ICD-10-CM | POA: Insufficient documentation

## 2022-05-06 DIAGNOSIS — M94261 Chondromalacia, right knee: Secondary | ICD-10-CM

## 2022-05-06 DIAGNOSIS — I10 Essential (primary) hypertension: Secondary | ICD-10-CM

## 2022-05-06 DIAGNOSIS — S83241A Other tear of medial meniscus, current injury, right knee, initial encounter: Secondary | ICD-10-CM | POA: Insufficient documentation

## 2022-05-06 DIAGNOSIS — Z6841 Body Mass Index (BMI) 40.0 and over, adult: Secondary | ICD-10-CM | POA: Insufficient documentation

## 2022-05-06 DIAGNOSIS — S83271A Complex tear of lateral meniscus, current injury, right knee, initial encounter: Secondary | ICD-10-CM

## 2022-05-06 DIAGNOSIS — S83231A Complex tear of medial meniscus, current injury, right knee, initial encounter: Secondary | ICD-10-CM

## 2022-05-06 DIAGNOSIS — M1711 Unilateral primary osteoarthritis, right knee: Secondary | ICD-10-CM | POA: Insufficient documentation

## 2022-05-06 DIAGNOSIS — Z01818 Encounter for other preprocedural examination: Secondary | ICD-10-CM

## 2022-05-06 HISTORY — PX: KNEE ARTHROSCOPY WITH MEDIAL MENISECTOMY: SHX5651

## 2022-05-06 HISTORY — PX: CHONDROPLASTY: SHX5177

## 2022-05-06 SURGERY — ARTHROSCOPY, KNEE, WITH MEDIAL MENISCECTOMY
Anesthesia: General | Site: Knee | Laterality: Right

## 2022-05-06 MED ORDER — BUPIVACAINE HCL 0.25 % IJ SOLN
INTRAMUSCULAR | Status: AC
  Filled 2022-05-06: qty 1

## 2022-05-06 MED ORDER — SODIUM CHLORIDE 0.9 % IR SOLN
Status: DC | PRN
  Administered 2022-05-06: 6000 mL

## 2022-05-06 MED ORDER — DEXAMETHASONE SODIUM PHOSPHATE 10 MG/ML IJ SOLN
INTRAMUSCULAR | Status: DC | PRN
  Administered 2022-05-06: 5 mg via INTRAVENOUS

## 2022-05-06 MED ORDER — CEPHALEXIN 500 MG PO CAPS: 500.0000 mg | ORAL_CAPSULE | Freq: Four times a day (QID) | ORAL | 0 refills | Status: AC

## 2022-05-06 MED ORDER — LIDOCAINE 2% (20 MG/ML) 5 ML SYRINGE
INTRAMUSCULAR | Status: DC | PRN
  Administered 2022-05-06: 40 mg via INTRAVENOUS

## 2022-05-06 MED ORDER — ROPIVACAINE HCL 7.5 MG/ML IJ SOLN
INTRAMUSCULAR | Status: DC | PRN
  Administered 2022-05-06: 20 mL via PERINEURAL

## 2022-05-06 MED ORDER — ACETAMINOPHEN 500 MG PO TABS
1000.0000 mg | ORAL_TABLET | Freq: Once | ORAL | Status: AC
  Administered 2022-05-06: 1000 mg via ORAL
  Filled 2022-05-06: qty 2

## 2022-05-06 MED ORDER — METHOCARBAMOL 500 MG PO TABS: 500.0000 mg | ORAL_TABLET | Freq: Four times a day (QID) | ORAL | 0 refills | Status: DC

## 2022-05-06 MED ORDER — EPHEDRINE SULFATE (PRESSORS) 50 MG/ML IJ SOLN
INTRAMUSCULAR | Status: DC | PRN
  Administered 2022-05-06: 5 mg via INTRAVENOUS
  Administered 2022-05-06: 10 mg via INTRAVENOUS

## 2022-05-06 MED ORDER — BUPIVACAINE HCL 0.25 % IJ SOLN
INTRAMUSCULAR | Status: DC | PRN
  Administered 2022-05-06: 20 mL

## 2022-05-06 MED ORDER — PROPOFOL 10 MG/ML IV BOLUS
INTRAVENOUS | Status: AC
  Filled 2022-05-06: qty 20

## 2022-05-06 MED ORDER — PROMETHAZINE HCL 25 MG/ML IJ SOLN: 6.2500 mg | INTRAMUSCULAR | Status: DC | PRN

## 2022-05-06 MED ORDER — OXYCODONE HCL 5 MG PO TABS: 5.0000 mg | ORAL_TABLET | Freq: Once | ORAL | Status: DC | PRN

## 2022-05-06 MED ORDER — MIDAZOLAM HCL 2 MG/2ML IJ SOLN
INTRAMUSCULAR | Status: AC
  Filled 2022-05-06: qty 2

## 2022-05-06 MED ORDER — OXYCODONE HCL 5 MG/5ML PO SOLN: 5.0000 mg | Freq: Once | ORAL | Status: DC | PRN

## 2022-05-06 MED ORDER — FENTANYL CITRATE (PF) 100 MCG/2ML IJ SOLN
INTRAMUSCULAR | Status: DC | PRN
  Administered 2022-05-06: 100 ug via INTRAVENOUS

## 2022-05-06 MED ORDER — MEPERIDINE HCL 50 MG/ML IJ SOLN: 6.2500 mg | INTRAMUSCULAR | Status: DC | PRN

## 2022-05-06 MED ORDER — FENTANYL CITRATE PF 50 MCG/ML IJ SOSY
50.0000 ug | PREFILLED_SYRINGE | INTRAMUSCULAR | Status: DC
  Administered 2022-05-06: 100 ug via INTRAVENOUS
  Filled 2022-05-06: qty 2

## 2022-05-06 MED ORDER — DEXAMETHASONE SODIUM PHOSPHATE 10 MG/ML IJ SOLN
INTRAMUSCULAR | Status: AC
  Filled 2022-05-06: qty 1

## 2022-05-06 MED ORDER — MIDAZOLAM HCL 2 MG/2ML IJ SOLN: 0.5000 mg | Freq: Once | INTRAMUSCULAR | Status: DC | PRN

## 2022-05-06 MED ORDER — OXYCODONE HCL 5 MG PO TABS: 5.0000 mg | ORAL_TABLET | ORAL | 0 refills | Status: AC | PRN

## 2022-05-06 MED ORDER — CEFAZOLIN IN SODIUM CHLORIDE 3-0.9 GM/100ML-% IV SOLN
3.0000 g | INTRAVENOUS | Status: AC
  Administered 2022-05-06: 3 g via INTRAVENOUS
  Filled 2022-05-06: qty 100

## 2022-05-06 MED ORDER — FENTANYL CITRATE (PF) 100 MCG/2ML IJ SOLN
INTRAMUSCULAR | Status: AC
  Filled 2022-05-06: qty 2

## 2022-05-06 MED ORDER — LIDOCAINE HCL (PF) 2 % IJ SOLN
INTRAMUSCULAR | Status: AC
  Filled 2022-05-06: qty 5

## 2022-05-06 MED ORDER — ONDANSETRON HCL 4 MG/2ML IJ SOLN
INTRAMUSCULAR | Status: DC | PRN
  Administered 2022-05-06: 4 mg via INTRAVENOUS

## 2022-05-06 MED ORDER — ONDANSETRON HCL 4 MG/2ML IJ SOLN
INTRAMUSCULAR | Status: AC
  Filled 2022-05-06: qty 2

## 2022-05-06 MED ORDER — MIDAZOLAM HCL 5 MG/5ML IJ SOLN
INTRAMUSCULAR | Status: DC | PRN
  Administered 2022-05-06: 1 mg via INTRAVENOUS

## 2022-05-06 MED ORDER — 0.9 % SODIUM CHLORIDE (POUR BTL) OPTIME
TOPICAL | Status: DC | PRN
  Administered 2022-05-06: 1000 mL

## 2022-05-06 MED ORDER — HYDROMORPHONE HCL 1 MG/ML IJ SOLN: 0.2500 mg | INTRAMUSCULAR | Status: DC | PRN

## 2022-05-06 MED ORDER — ORAL CARE MOUTH RINSE: 15.0000 mL | Freq: Once | OROMUCOSAL | Status: AC

## 2022-05-06 MED ORDER — CHLORHEXIDINE GLUCONATE 0.12 % MT SOLN
15.0000 mL | Freq: Once | OROMUCOSAL | Status: AC
  Administered 2022-05-06: 15 mL via OROMUCOSAL

## 2022-05-06 MED ORDER — LIDOCAINE-EPINEPHRINE (PF) 2 %-1:200000 IJ SOLN
INTRAMUSCULAR | Status: DC | PRN
  Administered 2022-05-06: 20 mL

## 2022-05-06 MED ORDER — SCOPOLAMINE 1 MG/3DAYS TD PT72
1.0000 | MEDICATED_PATCH | Freq: Once | TRANSDERMAL | Status: DC
  Administered 2022-05-06: 1.5 mg via TRANSDERMAL
  Filled 2022-05-06: qty 1

## 2022-05-06 MED ORDER — LACTATED RINGERS IV SOLN: INTRAVENOUS | Status: DC

## 2022-05-06 MED ORDER — MIDAZOLAM HCL 2 MG/2ML IJ SOLN
1.0000 mg | INTRAMUSCULAR | Status: DC
  Administered 2022-05-06: 2 mg via INTRAVENOUS
  Filled 2022-05-06: qty 2

## 2022-05-06 MED ORDER — ONDANSETRON HCL 4 MG PO TABS: 4.0000 mg | ORAL_TABLET | Freq: Three times a day (TID) | ORAL | 1 refills | Status: AC | PRN

## 2022-05-06 MED ORDER — PROPOFOL 10 MG/ML IV BOLUS
INTRAVENOUS | Status: DC | PRN
  Administered 2022-05-06: 50 mg via INTRAVENOUS
  Administered 2022-05-06: 100 mg via INTRAVENOUS
  Administered 2022-05-06 (×2): 50 mg via INTRAVENOUS

## 2022-05-06 MED ORDER — EPHEDRINE 5 MG/ML INJ
INTRAVENOUS | Status: AC
  Filled 2022-05-06: qty 5

## 2022-05-06 SURGICAL SUPPLY — 35 items
BAG COUNTER SPONGE SURGICOUNT (BAG) ×1 IMPLANT
BANDAGE ESMARK 6X9 LF (GAUZE/BANDAGES/DRESSINGS) IMPLANT
BLADE EXCALIBUR 4.0X13 (MISCELLANEOUS) ×1 IMPLANT
BNDG ELASTIC 6X10 VLCR STRL LF (GAUZE/BANDAGES/DRESSINGS) IMPLANT
BNDG ESMARK 6X9 LF (GAUZE/BANDAGES/DRESSINGS)
BNDG GAUZE DERMACEA FLUFF 4 (GAUZE/BANDAGES/DRESSINGS) ×2 IMPLANT
CANISTER SUCT 3000ML PPV (MISCELLANEOUS) ×1 IMPLANT
CUFF TOURN SGL QUICK 34 (TOURNIQUET CUFF)
CUFF TRNQT CYL 34X4.125X (TOURNIQUET CUFF) ×1 IMPLANT
DRAPE INCISE 23X17 IOBAN STRL (DRAPES) ×1
DRAPE INCISE 23X17 STRL (DRAPES) ×1 IMPLANT
DRAPE INCISE IOBAN 23X17 STRL (DRAPES) ×1 IMPLANT
DRAPE INCISE IOBAN 66X45 STRL (DRAPES) ×1 IMPLANT
DRAPE U-SHAPE 47X51 STRL (DRAPES) ×1 IMPLANT
DURAPREP 26ML APPLICATOR (WOUND CARE) ×1 IMPLANT
EXCALIBUR 3.8MM X 13CM (MISCELLANEOUS) ×1 IMPLANT
GAUZE PAD ABD 8X10 STRL (GAUZE/BANDAGES/DRESSINGS) ×2 IMPLANT
GAUZE SPONGE 4X4 12PLY STRL (GAUZE/BANDAGES/DRESSINGS) ×1 IMPLANT
GAUZE XEROFORM 1X8 LF (GAUZE/BANDAGES/DRESSINGS) ×1 IMPLANT
GLOVE BIOGEL PI IND STRL 7.5 (GLOVE) ×1 IMPLANT
GLOVE BIOGEL PI IND STRL 8 (GLOVE) ×1 IMPLANT
GLOVE ECLIPSE 8.0 STRL XLNG CF (GLOVE) ×1 IMPLANT
GLOVE SURG SS PI 7.0 STRL IVOR (GLOVE) ×1 IMPLANT
KIT BASIN OR (CUSTOM PROCEDURE TRAY) ×1 IMPLANT
KIT TURNOVER KIT A (KITS) IMPLANT
MANIFOLD NEPTUNE II (INSTRUMENTS) IMPLANT
PACK ARTHROSCOPY DSU (CUSTOM PROCEDURE TRAY) ×1 IMPLANT
PAD ARMBOARD 7.5X6 YLW CONV (MISCELLANEOUS) IMPLANT
SUT ETHILON 4 0 PS 2 18 (SUTURE) ×1 IMPLANT
SYR CONTROL 10ML LL (SYRINGE) ×1 IMPLANT
TOWEL OR 17X26 10 PK STRL BLUE (TOWEL DISPOSABLE) ×2 IMPLANT
TUBING ARTHROSCOPY IRRIG 16FT (MISCELLANEOUS) ×1 IMPLANT
TUBING CONNECTING 10 (TUBING) ×1 IMPLANT
WAND APOLLORF SJ50 AR-9845 (SURGICAL WAND) IMPLANT
WATER STERILE IRR 500ML POUR (IV SOLUTION) ×1 IMPLANT

## 2022-05-06 NOTE — Discharge Instructions (Signed)
Knee arthroscopy, partial medial menisectomy: -Please take aspirin 81 mg twice a day 1 in the morning 1 in the evening -Okay to take Tylenol 1000 mg 2-3 times a day with pain medication -Please ice and elevate the leg -Okay to start physical therapy 1 week after surgery  -Please follow-up in the office in 2 weeks -Please take a stool softener while taking opioid pain medication -Please take a probiotic while taking antibiotics for 3 days -Okay to remove bandages in 3 days and replace with bandaids, keep incision site clean

## 2022-05-06 NOTE — Anesthesia Postprocedure Evaluation (Signed)
Anesthesia Post Note  Patient: Anaia Frith  Procedure(s) Performed: KNEE ARTHROSCOPY WITH PARTIAL MEDIAL MENISECTOMY (Right: Knee) KNEE ARTHROSCOPY CHONDROPLASTY (Right: Knee)     Patient location during evaluation: PACU Anesthesia Type: General Level of consciousness: awake and alert, patient cooperative and oriented Pain management: pain level controlled Vital Signs Assessment: post-procedure vital signs reviewed and stable Respiratory status: spontaneous breathing, nonlabored ventilation and respiratory function stable Cardiovascular status: blood pressure returned to baseline and stable Postop Assessment: no apparent nausea or vomiting and adequate PO intake Anesthetic complications: no   No notable events documented.  Last Vitals:  Vitals:   05/06/22 1435 05/06/22 1445  BP: (!) 160/87 (!) 172/95  Pulse: (!) 110 99  Resp: 15 19  Temp: (!) 36.4 C   SpO2: 100% 99%    Last Pain:  Vitals:   05/06/22 1435  PainSc: 0-No pain                 Viyaan Champine,E. Mercades Bajaj

## 2022-05-06 NOTE — Interval H&P Note (Signed)
History and Physical Interval Note:  05/06/2022 1:19 PM  Lindsay Hansen  has presented today for surgery, with the diagnosis of Right knee medial meniscus tear, chondral flaps.  The various methods of treatment have been discussed with the patient and family. After consideration of risks, benefits and other options for treatment, the patient has consented to  Procedure(s) with comments: KNEE ARTHROSCOPY WITH PARTIAL MEDIAL MENISECTOMy (Right) - knee block KNEE ARTHROSCOPY CHONDROPLASTY (Right) - knee block as a surgical intervention.  The patient's history has been reviewed, patient examined, no change in status, stable for surgery.  I have reviewed the patient's chart and labs.  Questions were answered to the patient's satisfaction.     [name] ANDREW

## 2022-05-06 NOTE — Anesthesia Procedure Notes (Addendum)
Procedure Name: LMA Insertion Date/Time: 05/06/2022 1:40 PM  Performed by: Bonney Aid, CRNAPre-anesthesia Checklist: Patient identified, Emergency Drugs available, Suction available and Patient being monitored Patient Re-evaluated:Patient Re-evaluated prior to induction Oxygen Delivery Method: Circle system utilized Preoxygenation: Pre-oxygenation with 100% oxygen Induction Type: IV induction Ventilation: Mask ventilation without difficulty LMA: LMA inserted LMA Size: 4.0 Number of attempts: 1 Airway Equipment and Method: Bite block Placement Confirmation: positive ETCO2 Tube secured with: Tape Dental Injury: Teeth and Oropharynx as per pre-operative assessment

## 2022-05-06 NOTE — Anesthesia Procedure Notes (Signed)
Anesthesia Regional Block: Adductor canal block   Pre-Anesthetic Checklist: , timeout performed,  Correct Patient, Correct Site, Correct Laterality,  Correct Procedure, Correct Position, site marked,  Risks and benefits discussed,  Surgical consent,  Pre-op evaluation,  At surgeon's request and post-op pain management  Laterality: Right and Lower  Prep: chloraprep       Needles:  Injection technique: Single-shot  Needle Type: Echogenic Needle     Needle Length: 9cm  Needle Gauge: 21     Additional Needles:   Procedures:,,,, ultrasound used (permanent image in chart),,    Narrative:  Start time: 05/06/2022 1:05 PM End time: 05/06/2022 1:11 PM Injection made incrementally with aspirations every 5 mL.  Performed by: Personally  Anesthesiologist: Annye Asa, MD  Additional Notes: Pt identified in Holding room.  Monitors applied. Working IV access confirmed. Sterile prep R thigh.  #21ga ECHOgenic Arrow block needle into adductor canal with US guidance.  20cc 0.75% Ropivacaine injected incrementally after negative test dose.  Patient asymptomatic, VSS, no heme aspirated, tolerated well.   Jenita Seashore, MD

## 2022-05-06 NOTE — Transfer of Care (Signed)
Immediate Anesthesia Transfer of Care Note  Patient: Lindsay Hansen  Procedure(s) Performed: Procedure(s) (LRB): KNEE ARTHROSCOPY WITH PARTIAL MEDIAL MENISECTOMY (Right) KNEE ARTHROSCOPY CHONDROPLASTY (Right)  Patient Location: PACU  Anesthesia Type: General  Level of Consciousness: awake, sedated, patient cooperative and responds to stimulation  Airway & Oxygen Therapy: Patient Spontanous Breathing and Patient connected to face mask oxygen  Post-op Assessment: Report given to PACU RN, Post -op Vital signs reviewed and stable and Patient moving all extremities  Post vital signs: Reviewed and stable  Complications: No apparent anesthesia complications

## 2022-05-06 NOTE — Op Note (Signed)
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  Knee block was administered per anesthesiologist.  IV antibiotics were given within 1 hour of the surgical incision time.  She is then taken the operating room placed supine position under general anesthesia..  Right lower extremity was elevated prepped with DuraPrep and draped in sterile sterile fashion after timeout arthroscopic portal established inferomedial inferolateral diagnostic arthroscopy patellofemoral joint revealed grade 3 4 chondromalacia patella and femoral trochlea with marked stable chondral flaps to like shaving chondroplasty was performed back to stable edges.  ACL PCL were intact suprapatellar pouch medial lateral gutters unremarkable lateral side was inspected complex tear of the lateral meniscus used the shaver and cautery partial meniscectomy was performed back to stable edge probably beveled and contoured.  Is also grade 3 4 chondromalacia of the lateral femoral condyle and a chondroplasty was formed with a shaver back to stable age.  Swept to the medial compartment there she was found to have grade 348 changes.  Grade 4 changes.  Femoral condyle.  There was extensive tear of the medial meniscus and then utilized a shaver and cautery a  partial meniscectomy was performed back to a stable edge probably beveled and contoured.  There is no other abnormalities noted irrigated arthroscopic it was removed.  Portals were closed for nylon suture.  20 cc of 0.5% Marcaine was was placed in the knee and skin afterwards.  Sterile dressing applied compressive wrap normal circulation foot and ankle in the case.  She is awakened to operating PACU stable condition.  To be stabilized PACU discharged home.  Start therapy next week.  Prognosis is guarded secondary to mild arthritis she has in her BMI.  She will be encouraged to get her BMI well below 40 which should be a candidate for total knee arthroplasty.  All questions encouraged and answered patient's family afterwards via telephone for due to COVID-19 precautions.

## 2022-05-06 NOTE — Anesthesia Procedure Notes (Signed)
Anesthesia Regional Block: Knee block   Pre-Anesthetic Checklist: , timeout performed,  Correct Patient, Correct Site, Correct Laterality,  Correct Procedure, Correct Position, site marked,  Risks and benefits discussed,  Surgical consent,  Pre-op evaluation,  At surgeon's request and post-op pain management  Laterality: Right and Lower  Prep: chloraprep       Needles:  Injection technique: Single-shot  Needle Type: Echogenic Needle     Needle Length: 9cm  Needle Gauge: 21     Additional Needles:   Procedures:,,,, ultrasound used (permanent image in chart),,    Narrative:  Start time: 05/06/2022 1:12 PM End time: 05/06/2022 1:15 PM Injection made incrementally with aspirations every 5 mL.  Performed by: Personally  Anesthesiologist: Annye Asa, MD  Additional Notes: Pt identified in Holding room.  Monitors applied. Working IV access confirmed. Sterile prep lateral R patella.  #18ga into clear synovial fluid.  20cc 2% Lidocaine 1:200k epi injected incrementally after negative test dose.  Patient asymptomatic, VSS, no heme aspirated, tolerated well.   Jenita Seashore, MD

## 2022-05-06 NOTE — Interval H&P Note (Signed)
History and Physical Interval Note:  05/06/2022 1:18 PM  Lindsay Hansen  has presented today for surgery, with the diagnosis of Right knee medial meniscus tear, chondral flaps.  The various methods of treatment have been discussed with the patient and family. After consideration of risks, benefits and other options for treatment, the patient has consented to  Procedure(s) with comments: KNEE ARTHROSCOPY WITH PARTIAL MEDIAL MENISECTOMy (Right) - knee block KNEE ARTHROSCOPY CHONDROPLASTY (Right) - knee block as a surgical intervention.  The patient's history has been reviewed, patient examined, no change in status, stable for surgery.  I have reviewed the patient's chart and labs.  Questions were answered to the patient's satisfaction.     [name] ANDREW

## 2022-05-07 ENCOUNTER — Encounter (HOSPITAL_COMMUNITY): Payer: Self-pay | Admitting: Specialist

## 2022-10-15 DIAGNOSIS — I517 Cardiomegaly: Secondary | ICD-10-CM | POA: Diagnosis not present

## 2023-08-12 ENCOUNTER — Other Ambulatory Visit: Payer: Self-pay | Admitting: Obstetrics and Gynecology

## 2023-08-12 DIAGNOSIS — Z1231 Encounter for screening mammogram for malignant neoplasm of breast: Secondary | ICD-10-CM

## 2023-08-29 ENCOUNTER — Other Ambulatory Visit: Payer: Self-pay | Admitting: Specialist

## 2023-08-29 DIAGNOSIS — M8589 Other specified disorders of bone density and structure, multiple sites: Secondary | ICD-10-CM

## 2023-09-02 ENCOUNTER — Ambulatory Visit
Admission: RE | Admit: 2023-09-02 | Discharge: 2023-09-02 | Disposition: A | Discharge status: Home / Self Care | Payer: BC Managed Care – PPO | Source: Ambulatory Visit | Attending: Obstetrics and Gynecology | Admitting: Obstetrics and Gynecology

## 2023-09-02 DIAGNOSIS — Z1231 Encounter for screening mammogram for malignant neoplasm of breast: Secondary | ICD-10-CM

## 2023-10-05 ENCOUNTER — Other Ambulatory Visit: Payer: Self-pay | Admitting: Medical Genetics

## 2023-10-21 ENCOUNTER — Other Ambulatory Visit

## 2023-10-28 ENCOUNTER — Ambulatory Visit: Payer: BC Managed Care – PPO

## 2023-10-28 VITALS — Ht 66.0 in | Wt 302.0 lb

## 2023-10-28 DIAGNOSIS — Z860100 Personal history of colon polyps, unspecified: Secondary | ICD-10-CM

## 2023-10-28 MED ORDER — NA SULFATE-K SULFATE-MG SULF 17.5-3.13-1.6 GM/177ML PO SOLN: 1.0000 | Freq: Once | ORAL | 0 refills | Status: AC

## 2023-10-28 NOTE — Progress Notes (Signed)
 No egg or soy allergy known to patient  No issues known to pt with past sedation with any surgeries or procedures Patient denies ever being told they had issues or difficulty with intubation  No FH of Malignant Hyperthermia Pt is not on diet pills or GLP-1 medications Pt is not on home 02  Pt is not on blood thinners  Pt denies issues with chronic constipation  No A fib or A flutter Have any cardiac testing pending--no Ambulates on occasion with trekking poles

## 2023-11-04 ENCOUNTER — Other Ambulatory Visit

## 2023-11-04 DIAGNOSIS — Z006 Encounter for examination for normal comparison and control in clinical research program: Secondary | ICD-10-CM

## 2023-11-13 ENCOUNTER — Encounter: Payer: Self-pay | Admitting: Certified Registered Nurse Anesthetist

## 2023-11-15 ENCOUNTER — Encounter: Payer: Self-pay | Admitting: Gastroenterology

## 2023-11-15 LAB — GENECONNECT MOLECULAR SCREEN: Genetic Analysis Overall Interpretation: NEGATIVE

## 2023-11-21 ENCOUNTER — Ambulatory Visit (AMBULATORY_SURGERY_CENTER): Payer: BC Managed Care – PPO | Admitting: Gastroenterology

## 2023-11-21 ENCOUNTER — Encounter: Payer: Self-pay | Admitting: Gastroenterology

## 2023-11-21 VITALS — BP 143/98 | HR 78 | Temp 97.7°F | Resp 13 | Ht 66.0 in | Wt 302.0 lb

## 2023-11-21 DIAGNOSIS — Z1211 Encounter for screening for malignant neoplasm of colon: Secondary | ICD-10-CM

## 2023-11-21 DIAGNOSIS — K640 First degree hemorrhoids: Secondary | ICD-10-CM

## 2023-11-21 DIAGNOSIS — Z860100 Personal history of colon polyps, unspecified: Secondary | ICD-10-CM | POA: Diagnosis not present

## 2023-11-21 DIAGNOSIS — K5730 Diverticulosis of large intestine without perforation or abscess without bleeding: Secondary | ICD-10-CM | POA: Diagnosis not present

## 2023-11-21 MED ORDER — SODIUM CHLORIDE 0.9 % IV SOLN
500.0000 mL | Freq: Once | INTRAVENOUS | Status: DC
Start: 2023-11-21 — End: 2023-11-21

## 2023-11-21 NOTE — Progress Notes (Signed)
 Pt's states no medical or surgical changes since previsit or office visit.

## 2023-11-21 NOTE — Patient Instructions (Signed)

## 2023-11-21 NOTE — Progress Notes (Signed)
 [facility] History and Physical   Primary Care Physician:  Abbe Hoard., MD   Reason for Procedure:   H/O polyps  Plan:    colon     HPI: Lindsay Hansen is a 53 y.o. female    Past Medical History:  Diagnosis Date   Allergy    Anemia    past hx- none since menopause    Anxiety    Blood transfusion without reported diagnosis    GERD (gastroesophageal reflux disease)    Hyperlipidemia    Hypertension    Hypothyroidism    Myasthenia gravis    ocular   Psoriatic arthritis (HCC)     Past Surgical History:  Procedure Laterality Date   ABDOMINOPLASTY  2016   nicked blood vessel- hematoma- had to have surgery for the hematoma with blood transfusion    BACK SURGERY  2022   L3-L4 decompression and fusion   CESAREAN SECTION  05-15-2004 and 02-17-2006   CHOLECYSTECTOMY  11/2004   CHONDROPLASTY Right 05/06/2022   Procedure: KNEE ARTHROSCOPY CHONDROPLASTY;  Surgeon: Genevie Kerns, MD;  Location: [facility];  Service: Orthopedics;  Laterality: Right;  knee block   COLONOSCOPY     > 15 yrs ago- normal per pt    gastric sleeve  08/16/2011   KNEE ARTHROSCOPY WITH MEDIAL MENISECTOMY Right 05/06/2022   Procedure: KNEE ARTHROSCOPY WITH PARTIAL MEDIAL MENISECTOMY;  Surgeon: Genevie Kerns, MD;  Location: [facility];  Service: Orthopedics;  Laterality: Right;  knee block   LASIK Bilateral 2004   MICRODISCECTOMY LUMBAR  2023   L5   THYMECTOMY  2014   Laparoscopic   UMBILICAL HERNIA REPAIR  08/16/2011    Prior to Admission medications   Medication Sig Start Date End Date Taking? Authorizing Provider  acetaminophen  (TYLENOL ) 500 MG tablet Take 1,000 mg by mouth every 8 (eight) hours as needed for moderate pain.   Yes [provider]  celecoxib  (CELEBREX ) 200 MG capsule Take 200 mg by mouth daily. 07/10/20  Yes [provider]  clotrimazole -betamethasone  (LOTRISONE ) lotion Apply 1 application topically daily as needed (psoriasis). 10/06/18  Yes [provider]  Coenzyme Q10 100 MG capsule Take 100 mg by mouth daily.   Yes [provider]  diazepam  (VALIUM ) 10 MG tablet Take 10 mg by mouth 3 (three) times daily as needed for anxiety or sleep. 06/06/19  Yes [provider]  DULoxetine (CYMBALTA) 60 MG capsule Take 60 mg by mouth daily.   Yes [provider]  estradiol  (ESTRACE ) 1 MG tablet Take 1 mg by mouth daily.   Yes [provider]  famotidine  (PEPCID ) 20 MG tablet Take 20 mg by mouth daily as needed for heartburn or indigestion.   Yes [provider]  fluocinolone (SYNALAR) 0.025 % cream Apply 1 Application topically 2 (two) times daily as needed. 10/31/23  Yes [provider]  gabapentin (NEURONTIN) 100 MG capsule Take 100 mg by mouth 3 (three) times daily.   Yes [provider]  hydrochlorothiazide  (HYDRODIURIL ) 25 MG tablet Take 25 mg by mouth daily. 05/03/20  Yes [provider]  lansoprazole (PREVACID) 30 MG capsule Take 30 mg by mouth daily.   Yes [provider]  levothyroxine  (SYNTHROID ) 112 MCG tablet Take 112 mcg by mouth daily before breakfast.   Yes [provider]  lisinopril  (ZESTRIL ) 5 MG tablet Take 5 mg by mouth daily.   Yes [provider]  loratadine (CLARITIN) 10 MG tablet Take 10 mg by mouth daily as  needed for allergies.   Yes [provider]  methocarbamol  (ROBAXIN ) 500 MG tablet Take 1 tablet (500 mg total) by mouth every 8 (eight) hours as needed for muscle spasms. 06/18/21  Yes Augusto Blonder, MD  metoprolol succinate (TOPROL-XL) 25 MG 24 hr tablet Take 25 mg by mouth daily.   Yes [provider]  oxyCODONE -acetaminophen  (PERCOCET) 5-325 MG tablet 2 tablets every 4 hours as needed 05/05/22  Yes Sofia, Leslie K, PA-C  potassium chloride  (MICRO-K ) 10 MEQ CR capsule Take 10 mEq by mouth daily. 05/20/21  Yes [provider]  predniSONE  (DELTASONE ) 10 MG tablet Take 10 mg by mouth daily with  breakfast. 08/10/11  Yes [provider]  progesterone  (PROMETRIUM ) 100 MG capsule Take 100 mg by mouth daily.   Yes [provider]  rosuvastatin  (CRESTOR ) 20 MG tablet Take 20 mg by mouth daily.   Yes [provider]  sertraline  (ZOLOFT ) 50 MG tablet Take 50 mg by mouth daily.   Yes [provider]  Upadacitinib  ER (RINVOQ ) 15 MG TB24 Take 15 mg by mouth daily.   Yes [provider]  Vitamin D , Ergocalciferol , (DRISDOL ) 1.25 MG (50000 UNIT) CAPS capsule Take 50,000 Units by mouth every Sunday. 07/17/20  Yes [provider]  albuterol (VENTOLIN HFA) 108 (90 Base) MCG/ACT inhaler Inhale 1-2 puffs into the lungs every 6 (six) hours as needed for shortness of breath.    [provider]  Cyanocobalamin  (B-12 COMPLIANCE INJECTION) 1000 MCG/ML KIT Inject 1,000 mcg as directed every 30 (thirty) days.    [provider]  furosemide (LASIX) 20 MG tablet Take 20 mg by mouth daily as needed for edema. 07/23/22   [provider]    Current Outpatient Medications  Medication Sig Dispense Refill   acetaminophen  (TYLENOL ) 500 MG tablet Take 1,000 mg by mouth every 8 (eight) hours as needed for moderate pain.     celecoxib  (CELEBREX ) 200 MG capsule Take 200 mg by mouth daily.     clotrimazole -betamethasone  (LOTRISONE ) lotion Apply 1 application topically daily as needed (psoriasis).     Coenzyme Q10 100 MG capsule Take 100 mg by mouth daily.     diazepam  (VALIUM ) 10 MG tablet Take 10 mg by mouth 3 (three) times daily as needed for anxiety or sleep.     DULoxetine (CYMBALTA) 60 MG capsule Take 60 mg by mouth daily.     estradiol  (ESTRACE ) 1 MG tablet Take 1 mg by mouth daily.     famotidine  (PEPCID ) 20 MG tablet Take 20 mg by mouth daily as needed for heartburn or indigestion.     fluocinolone (SYNALAR) 0.025 % cream Apply 1 Application topically 2 (two) times daily as needed.     gabapentin (NEURONTIN) 100 MG capsule Take 100 mg by  mouth 3 (three) times daily.     hydrochlorothiazide  (HYDRODIURIL ) 25 MG tablet Take 25 mg by mouth daily.     lansoprazole (PREVACID) 30 MG capsule Take 30 mg by mouth daily.     levothyroxine  (SYNTHROID ) 112 MCG tablet Take 112 mcg by mouth daily before breakfast.     lisinopril  (ZESTRIL ) 5 MG tablet Take 5 mg by mouth daily.     loratadine (CLARITIN) 10 MG tablet Take 10 mg by mouth daily as needed for allergies.     methocarbamol  (ROBAXIN ) 500 MG tablet Take 1 tablet (500 mg total) by mouth every 8 (eight) hours as needed for muscle spasms. 60 tablet 0   metoprolol succinate (TOPROL-XL) 25 MG  24 hr tablet Take 25 mg by mouth daily.     oxyCODONE -acetaminophen  (PERCOCET) 5-325 MG tablet 2 tablets every 4 hours as needed 20 tablet 0   potassium chloride  (MICRO-K ) 10 MEQ CR capsule Take 10 mEq by mouth daily.     predniSONE  (DELTASONE ) 10 MG tablet Take 10 mg by mouth daily with breakfast.     progesterone  (PROMETRIUM ) 100 MG capsule Take 100 mg by mouth daily.     rosuvastatin  (CRESTOR ) 20 MG tablet Take 20 mg by mouth daily.     sertraline  (ZOLOFT ) 50 MG tablet Take 50 mg by mouth daily.     Upadacitinib  ER (RINVOQ ) 15 MG TB24 Take 15 mg by mouth daily.     Vitamin D , Ergocalciferol , (DRISDOL ) 1.25 MG (50000 UNIT) CAPS capsule Take 50,000 Units by mouth every Sunday.     albuterol (VENTOLIN HFA) 108 (90 Base) MCG/ACT inhaler Inhale 1-2 puffs into the lungs every 6 (six) hours as needed for shortness of breath.     Cyanocobalamin  (B-12 COMPLIANCE INJECTION) 1000 MCG/ML KIT Inject 1,000 mcg as directed every 30 (thirty) days.     furosemide (LASIX) 20 MG tablet Take 20 mg by mouth daily as needed for edema.     Current Facility-Administered Medications  Medication Dose Route Frequency Provider Last Rate Last Admin   0.9 %  sodium chloride  infusion  500 mL Intravenous Once Lajuan Pila, MD        Allergies as of 11/21/2023   (No Known Allergies)    Family History  Problem Relation Age  of Onset   Asthma Mother    Heart disease Mother    Heart disease Father    Prostate cancer Father    Breast cancer Maternal Aunt 68   Colon cancer Neg Hx    Colon polyps Neg Hx    Esophageal cancer Neg Hx    Rectal cancer Neg Hx    Stomach cancer Neg Hx    BRCA 1/2 Neg Hx     Social History   Socioeconomic History   Marital status: Married    Spouse name: Not on file   Number of children: 2   Years of education: Not on file   Highest education level: Not on file  Occupational History   Occupation: physician  Tobacco Use   Smoking status: Never   Smokeless tobacco: Never  Vaping Use   Vaping status: Never Used  Substance and Sexual Activity   Alcohol use: Not Currently    Alcohol/week: 1.0 standard drink of alcohol    Types: 1 Standard drinks or equivalent per week    Comment: occ-   Drug use: No   Sexual activity: Not Currently    Birth control/protection: Post-menopausal  Other Topics Concern   Not on file  Social History Narrative   Internal medicine MD   Social Drivers of Health   Financial Resource Strain: Not on file  Food Insecurity: Not on file  Transportation Needs: Not on file  Physical Activity: Not on file  Stress: Not on file  Social Connections: Not on file  Intimate Partner Violence: Not on file    Review of Systems: Positive for none All other review of systems negative except as mentioned in the HPI.  Physical Exam: Vital signs in last 24 hours: @VSRANGES @   General:   Alert,  Well-developed, well-nourished, pleasant and cooperative in NAD Lungs:  Clear throughout to auscultation.   Heart:  Regular rate and rhythm; no murmurs, clicks, rubs,  or gallops. Abdomen:  Soft, nontender and nondistended. Normal bowel sounds.   Neuro/Psych:  Alert and cooperative. Normal mood and affect. A and O x 3    No significant changes were identified.  The patient continues to be an appropriate candidate for the planned procedure and  anesthesia.   Magnus Schuller, MD. [facility] 11/21/2023 8:06 AM@

## 2023-11-21 NOTE — Op Note (Signed)
 [facility] Patient Name: Lindsay Hansen Procedure Date: 11/21/2023 8:00 AM MRN: [id number] Endoscopist: Lajuan Pila , MD, [id number] Age: 53 Referring MD:  Date of Birth: 08-30-1970 Gender: Female Account #: [id number] Procedure:                Colonoscopy Indications:              High risk colon cancer surveillance: Personal                            history of colonic polyps Medicines:                Monitored Anesthesia Care Procedure:                Pre-Anesthesia Assessment:                           - Prior to the procedure, a History and Physical                            was performed, and patient medications and                            allergies were reviewed. The patient's tolerance of                            previous anesthesia was also reviewed. The risks                            and benefits of the procedure and the sedation                            options and risks were discussed with the patient.                            All questions were answered, and informed consent                            was obtained. Prior Anticoagulants: The patient has                            taken no anticoagulant or antiplatelet agents. ASA                            Grade Assessment: II - A patient with mild systemic                            disease. After reviewing the risks and benefits,                            the patient was deemed in satisfactory condition to                            undergo the procedure.  After obtaining informed consent, the colonoscope                            was passed under direct vision. Throughout the                            procedure, the patient's blood pressure, pulse, and                            oxygen saturations were monitored continuously. The                            Olympus Scope SN: [id number] was introduced through                            the anus and advanced to the the  cecum, identified                            by appendiceal orifice and ileocecal valve. The                            colonoscopy was performed without difficulty. The                            patient tolerated the procedure well. The quality                            of the bowel preparation was good. The ileocecal                            valve, appendiceal orifice, and rectum were                            photographed. Scope In: 8:09:57 AM Scope Out: 8:22:54 AM Scope Withdrawal Time: 0 hours 9 minutes 28 seconds  Total Procedure Duration: 0 hours 12 minutes 57 seconds  Findings:                 A few rare small-mouthed diverticula were found in                            the sigmoid colon.                           Non-bleeding internal hemorrhoids were found during                            retroflexion. The hemorrhoids were small and Grade                            I (internal hemorrhoids that do not prolapse).                           The exam was otherwise without abnormality on  direct and retroflexion views. Complications:            No immediate complications. Estimated Blood Loss:     Estimated blood loss: none. Impression:               - Minimal sigmoid diverticulosis.                           - Non-bleeding internal hemorrhoids.                           - The examination was otherwise normal on direct                            and retroflexion views.                           - No specimens collected. Recommendation:           - Patient has a contact number available for                            emergencies. The signs and symptoms of potential                            delayed complications were discussed with the                            patient. Return to normal activities tomorrow.                            Written discharge instructions were provided to the                            patient.                           -  Resume previous diet.                           - Continue present medications.                           - Repeat colonoscopy in 5 years for screening                            purposes d/t prev H/O multiple polyps.                           - The findings and recommendations were discussed                            with the patient's family. Lajuan Pila, MD 11/21/2023 8:29:00 AM This report has been signed electronically.

## 2023-11-22 ENCOUNTER — Telehealth: Payer: Self-pay

## 2023-11-22 NOTE — Telephone Encounter (Signed)
  Follow up Call-     11/21/2023    7:31 AM  Call back number  Post procedure Call Back phone  # [phone]  Permission to leave phone message Yes     Patient questions:  Do you have a fever, pain , or abdominal swelling? No. Pain Score  0 *  Have you tolerated food without any problems? Yes.    Have you been able to return to your normal activities? Yes.    Do you have any questions about your discharge instructions: Diet   No. Medications  No. Follow up visit  No.  Do you have questions or concerns about your Care? No.  Actions: * If pain score is 4 or above: No action needed, pain <4.

## 2024-01-24 HISTORY — PX: TOTAL KNEE ARTHROPLASTY: SHX125

## 2024-04-27 ENCOUNTER — Other Ambulatory Visit: Payer: Self-pay

## 2024-05-11 ENCOUNTER — Other Ambulatory Visit: Payer: Self-pay | Admitting: Specialist

## 2024-05-11 DIAGNOSIS — M4807 Spinal stenosis, lumbosacral region: Secondary | ICD-10-CM

## 2024-05-17 ENCOUNTER — Ambulatory Visit (INDEPENDENT_AMBULATORY_CARE_PROVIDER_SITE_OTHER)
Admission: RE | Admit: 2024-05-17 | Discharge: 2024-05-17 | Disposition: A | Discharge status: Home / Self Care | Source: Ambulatory Visit | Attending: Specialist | Admitting: Specialist

## 2024-05-17 DIAGNOSIS — M4807 Spinal stenosis, lumbosacral region: Secondary | ICD-10-CM

## 2024-05-17 MED ORDER — GADOBUTROL 1 MMOL/ML IV SOLN
10.0000 mL | Freq: Once | INTRAVENOUS | Status: AC | PRN
Start: 2024-05-17 — End: 2024-05-17
  Administered 2024-05-17: 10 mL via INTRAVENOUS

## 2024-05-24 ENCOUNTER — Other Ambulatory Visit

## 2024-05-24 ENCOUNTER — Other Ambulatory Visit: Payer: Self-pay | Admitting: Neurosurgery

## 2024-06-04 NOTE — Pre-Procedure Instructions (Signed)
 Surgical Instructions   Your procedure is scheduled on June 15, 2024. Report to [facility] Main Entrance A at 5:30 A.M., then check in with the Admitting office. Any questions or running late day of surgery: call [phone]  Questions prior to your surgery date: call [phone], Monday-Friday, 8am-4pm. If you experience any cold or flu symptoms such as cough, fever, chills, shortness of breath, etc. between now and your scheduled surgery, please notify us  at the above number.     Remember:  Do not eat or drink after midnight the night before your surgery   Take these medicines the morning of surgery with A SIP OF WATER: DULoxetine (CYMBALTA)  estradiol  (ESTRACE )  lansoprazole (PREVACID)  levothyroxine  (SYNTHROID )  metoprolol succinate (TOPROL-XL)  predniSONE  (DELTASONE )  progesterone  (PROMETRIUM )  rosuvastatin  (CRESTOR )  Upadacitinib  ER (RINVOQ )    May take these medicines IF NEEDED: acetaminophen  (TYLENOL )  famotidine  (PEPCID )  gabapentin (NEURONTIN)  loratadine (CLARITIN)  oxyCODONE -acetaminophen  (PERCOCET)    One week prior to surgery, STOP taking any Aspirin (unless otherwise instructed by your surgeon) Aleve, Naproxen, Ibuprofen, Motrin, Advil, Goody's, BC's, all herbal medications, fish oil, and non-prescription vitamins.                     Do NOT Smoke (Tobacco/Vaping) for 24 hours prior to your procedure.  If you use a CPAP at night, you may bring your mask/headgear for your overnight stay.   You will be asked to remove any contacts, glasses, piercing's, hearing aid's, dentures/partials prior to surgery. Please bring cases for these items if needed.    Patients discharged the day of surgery will not be allowed to drive home, and someone needs to stay with them for 24 hours.  SURGICAL WAITING ROOM VISITATION Patients may have no more than 2 support people in the waiting area - these visitors may rotate.   Pre-op nurse will coordinate an appropriate  time for 1 ADULT support person, who may not rotate, to accompany patient in pre-op.  Children under the age of 7 must have an adult with them who is not the patient and must remain in the main waiting area with an adult.  If the patient needs to stay at the hospital during part of their recovery, the visitor guidelines for inpatient rooms apply.  Please refer to the [facility] website for the visitor guidelines for any additional information.   If you received a COVID test during your pre-op visit  it is requested that you wear a mask when out in public, stay away from anyone that may not be feeling well and notify your surgeon if you develop symptoms. If you have been in contact with anyone that has tested positive in the last 10 days please notify you surgeon.      Pre-operative 4 CHG Bathing Instructions   You can play a key role in reducing the risk of infection after surgery. Your skin needs to be as free of germs as possible. You can reduce the number of germs on your skin by washing with CHG (chlorhexidine  gluconate) soap before surgery. CHG is an antiseptic soap that kills germs and continues to kill germs even after washing.   DO NOT use if you have an allergy to chlorhexidine /CHG or antibacterial soaps. If your skin becomes reddened or irritated, stop using the CHG and notify one of our RNs at [phone].   Please shower with the CHG soap starting 4 days before surgery using the following schedule:  Please keep in mind the following:  DO NOT shave, including legs and underarms, starting the day of your first shower.   You may shave your face at any point before/day of surgery.  Place clean sheets on your bed the day you start using CHG soap. Use a clean washcloth (not used since being washed) for each shower. DO NOT sleep with pets once you start using the CHG.   CHG Shower Instructions:  Wash your face and private area with normal soap. If you choose to wash your  hair, wash first with your normal shampoo.  After you use shampoo/soap, rinse your hair and body thoroughly to remove shampoo/soap residue.  Turn the water OFF and apply  bottle of CHG soap to a CLEAN washcloth.  Apply CHG soap ONLY FROM YOUR NECK DOWN TO YOUR TOES (washing for 3-5 minutes)  DO NOT use CHG soap on face, private areas, open wounds, or sores.  Pay special attention to the area where your surgery is being performed.  If you are having back surgery, having someone wash your back for you may be helpful. Wait 2 minutes after CHG soap is applied, then you may rinse off the CHG soap.  Pat dry with a clean towel  Put on clean clothes/pajamas   If you choose to wear lotion, please use ONLY the CHG-compatible lotions that are listed below.  Additional instructions for the day of surgery:  If you choose, you may shower the morning of surgery with an antibacterial soap.  DO NOT APPLY any lotions, deodorants, cologne, or perfumes.   Do not bring valuables to the hospital. [facility] is not responsible for any belongings/valuables. Do not wear nail polish, gel polish, artificial nails, or any other type of covering on natural nails (fingers and toes) Do not wear jewelry or makeup Put on clean/comfortable clothes.  Please brush your teeth.  Ask your nurse before applying any prescription medications to the skin.     CHG Compatible Lotions   Aveeno Moisturizing lotion  Cetaphil Moisturizing Cream  Cetaphil Moisturizing Lotion  Clairol Herbal Essence Moisturizing Lotion, Dry Skin  Clairol Herbal Essence Moisturizing Lotion, Extra Dry Skin  Clairol Herbal Essence Moisturizing Lotion, Normal Skin  Curel Age Defying Therapeutic Moisturizing Lotion with Alpha Hydroxy  Curel Extreme Care Body Lotion  Curel Soothing Hands Moisturizing Hand Lotion  Curel Therapeutic Moisturizing Cream, Fragrance-Free  Curel Therapeutic Moisturizing Lotion, Fragrance-Free  Curel Therapeutic  Moisturizing Lotion, Original Formula  Eucerin Daily Replenishing Lotion  Eucerin Dry Skin Therapy Plus Alpha Hydroxy Crme  Eucerin Dry Skin Therapy Plus Alpha Hydroxy Lotion  Eucerin Original Crme  Eucerin Original Lotion  Eucerin Plus Crme Eucerin Plus Lotion  Eucerin TriLipid Replenishing Lotion  Keri Anti-Bacterial Hand Lotion  Keri Deep Conditioning Original Lotion Dry Skin Formula Softly Scented  Keri Deep Conditioning Original Lotion, Fragrance Free Sensitive Skin Formula  Keri Lotion Fast Absorbing Fragrance Free Sensitive Skin Formula  Keri Lotion Fast Absorbing Softly Scented Dry Skin Formula  Keri Original Lotion  Keri Skin Renewal Lotion Keri Silky Smooth Lotion  Keri Silky Smooth Sensitive Skin Lotion  Nivea Body Creamy Conditioning Oil  Nivea Body Extra Enriched Lotion  Nivea Body Original Lotion  Nivea Body Sheer Moisturizing Lotion Nivea Crme  Nivea Skin Firming Lotion  NutraDerm 30 Skin Lotion  NutraDerm Skin Lotion  NutraDerm Therapeutic Skin Cream  NutraDerm Therapeutic Skin Lotion  ProShield Protective Hand Cream  Provon moisturizing lotion  Please read over the following fact sheets that  you were given.

## 2024-06-05 ENCOUNTER — Other Ambulatory Visit: Payer: Self-pay

## 2024-06-05 ENCOUNTER — Encounter (HOSPITAL_COMMUNITY)
Admission: RE | Admit: 2024-06-05 | Discharge: 2024-06-05 | Disposition: A | Discharge status: Home / Self Care | Source: Ambulatory Visit | Attending: Neurosurgery | Admitting: Neurosurgery

## 2024-06-05 ENCOUNTER — Encounter (HOSPITAL_COMMUNITY): Payer: Self-pay

## 2024-06-05 VITALS — HR 106 | Temp 98.5°F | Resp 18 | Ht 66.0 in | Wt 307.0 lb

## 2024-06-05 DIAGNOSIS — K219 Gastro-esophageal reflux disease without esophagitis: Secondary | ICD-10-CM | POA: Diagnosis not present

## 2024-06-05 DIAGNOSIS — K449 Diaphragmatic hernia without obstruction or gangrene: Secondary | ICD-10-CM | POA: Diagnosis not present

## 2024-06-05 DIAGNOSIS — Z903 Acquired absence of stomach [part of]: Secondary | ICD-10-CM | POA: Insufficient documentation

## 2024-06-05 DIAGNOSIS — Z96651 Presence of right artificial knee joint: Secondary | ICD-10-CM | POA: Diagnosis not present

## 2024-06-05 DIAGNOSIS — E039 Hypothyroidism, unspecified: Secondary | ICD-10-CM | POA: Diagnosis not present

## 2024-06-05 DIAGNOSIS — L4050 Arthropathic psoriasis, unspecified: Secondary | ICD-10-CM | POA: Insufficient documentation

## 2024-06-05 DIAGNOSIS — G7000 Myasthenia gravis without (acute) exacerbation: Secondary | ICD-10-CM | POA: Diagnosis not present

## 2024-06-05 DIAGNOSIS — E669 Obesity, unspecified: Secondary | ICD-10-CM | POA: Diagnosis not present

## 2024-06-05 DIAGNOSIS — Z01818 Encounter for other preprocedural examination: Secondary | ICD-10-CM

## 2024-06-05 DIAGNOSIS — Z981 Arthrodesis status: Secondary | ICD-10-CM | POA: Insufficient documentation

## 2024-06-05 DIAGNOSIS — Z01812 Encounter for preprocedural laboratory examination: Secondary | ICD-10-CM | POA: Diagnosis not present

## 2024-06-05 DIAGNOSIS — I10 Essential (primary) hypertension: Secondary | ICD-10-CM | POA: Insufficient documentation

## 2024-06-05 DIAGNOSIS — M51369 Other intervertebral disc degeneration, lumbar region without mention of lumbar back pain or lower extremity pain: Secondary | ICD-10-CM | POA: Insufficient documentation

## 2024-06-05 DIAGNOSIS — Z7952 Long term (current) use of systemic steroids: Secondary | ICD-10-CM | POA: Diagnosis not present

## 2024-06-05 HISTORY — DX: Personal history of other diseases of the digestive system: Z87.19

## 2024-06-05 LAB — SURGICAL PCR SCREEN
MRSA, PCR: NEGATIVE
Staphylococcus aureus: POSITIVE — AB

## 2024-06-05 LAB — CBC
HCT: 39.4 % (ref 36.0–46.0)
Hemoglobin: 13.1 g/dL (ref 12.0–15.0)
MCH: 31.1 pg (ref 26.0–34.0)
MCHC: 33.2 g/dL (ref 30.0–36.0)
MCV: 93.6 fL (ref 80.0–100.0)
Platelets: 340 K/uL (ref 150–400)
RBC: 4.21 MIL/uL (ref 3.87–5.11)
RDW: 13.2 % (ref 11.5–15.5)
WBC: 10.1 K/uL (ref 4.0–10.5)
nRBC: 0 % (ref 0.0–0.2)

## 2024-06-05 LAB — BASIC METABOLIC PANEL WITH GFR
Anion gap: 13 (ref 5–15)
BUN: 13 mg/dL (ref 6–20)
CO2: 23 mmol/L (ref 22–32)
Calcium: 9.3 mg/dL (ref 8.9–10.3)
Chloride: 101 mmol/L (ref 98–111)
Creatinine, Ser: 0.92 mg/dL (ref 0.44–1.00)
GFR, Estimated: >60 mL/min (ref >60)
Glucose, Bld: 113 mg/dL — ABNORMAL HIGH (ref 70–99)
Potassium: 3.5 mmol/L (ref 3.5–5.1)
Sodium: 137 mmol/L (ref 135–145)

## 2024-06-05 LAB — TYPE AND SCREEN
ABO/RH(D): O POS
Antibody Screen: NEGATIVE

## 2024-06-05 NOTE — Progress Notes (Signed)
 PCP - Dr. Cathlyn Nash Cardiologist - Denies  PPM/ICD - Denies Device Orders - n/a Rep Notified - n/a  Chest x-ray - n/a EKG - June 2025 - tracing requested Stress Test - Denies ECHO - 10/15/2022 Cardiac Cath - Denies CT Coronary - 10/15/2022 ([facility])  Sleep Study - Denies CPAP - n/a  No DM  Last dose of GLP1 agonist- n/a GLP1 instructions: n/a  Blood Thinner Instructions: n/a Aspirin Instructions: n/a  NPO after midnight  COVID TEST- n/a   Anesthesia review: Yes. HTN and Myathenia Gravis (in remission per pt). EKG tracing requested.  Patient denies shortness of breath, fever, cough and chest pain at PAT appointment. Pt denies any respiratory illness/infection in the last two months.   All instructions explained to the patient, with a verbal understanding of the material. Patient agrees to go over the instructions while at home for a better understanding. Patient also instructed to self quarantine after being tested for COVID-19. The opportunity to ask questions was provided.

## 2024-06-06 NOTE — Anesthesia Preprocedure Evaluation (Addendum)
 Anesthesia Evaluation  Patient identified by MRN, date of birth, ID band Patient awake    Reviewed: Allergy & Precautions, NPO status , Patient's Chart, lab work & pertinent test results  History of Anesthesia Complications Negative for: history of anesthetic complications  Airway Mallampati: II  TM Distance: >3 FB Neck ROM: Full    Dental  (+) Teeth Intact, Dental Advisory Given   Pulmonary    breath sounds clear to auscultation       Cardiovascular hypertension, Pt. on medications  Rhythm:Regular Rate:Normal  TTE (2024): Normal Biventricular Function   Neuro/Psych   Anxiety     Myasthenia Gravis (Ocular, in remission in 2025) s/p Thymectomy  Neuromuscular disease (Myasthenia Gravis s/p Thymectomy)    GI/Hepatic hiatal hernia,GERD  ,,S/p Gastric Sleeve   Endo/Other  Hypothyroidism    Renal/GU      Musculoskeletal  (+) Arthritis  (Psoriatic Arthritis on Prednisone  10 mg daily),    Abdominal   Peds  Hematology  (+) Blood dyscrasia, anemia Hgb 13.1, Plts 340k (06/05/24)   Anesthesia Other Findings   Reproductive/Obstetrics                              Anesthesia Physical Anesthesia Plan  ASA: 3  Anesthesia Plan: General   Post-op Pain Management:    Induction: Intravenous  PONV Risk Score and Plan: 3 and Ondansetron , Dexamethasone , Treatment may vary due to age or medical condition and Midazolam   Airway Management Planned: Oral ETT and Video Laryngoscope Planned  Additional Equipment:   Intra-op Plan:   Post-operative Plan: Extubation in OR  Informed Consent:      Dental advisory given  Plan Discussed with: CRNA  Anesthesia Plan Comments: (PAT note written 06/06/2024 by Allison Zelenak, PA-C. She is a internal medicine physician.  Will plan to give Decadron  10 mg x 1 intraoperatively for stress dose steroids given her use of Prednisone  10 mg daily.  )          Anesthesia Quick Evaluation

## 2024-06-06 NOTE — Progress Notes (Signed)
 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  History includes never smoker, HTN, Myasthenia Gravis (s/p robotic thymectomy 01/23/2013), psoriatic arthritis (on prednisone  10 mg daily, Rinvoq ), GERD, hiatal hernia, hypothyroidism, obesity (s/p laparoscopic sleeve gastrectomy 08/16/2011; panniculectomy 01/28/2015), osteoarthritis (s/p right TKA 01/24/2024 in [place]), spinal surgery (L3-4 PLIF 06/17/2021).    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 She is s/p thymectomy on 01/23/2013. She has been on mestinon in the past, but not recently. She has been on hydrocortisone in the past, but more recently she is on prednisone  10 mg daily for psoriatic arthritis and not for MG. She was also on Cellcept from ~ 2013-2021. She has been on Rinvoq  for psoriatic arthritis. Per her last visit noted on 04/08/2022, She appears to be in either complete remission or pharmacologically induced complete remission.   Last primary care follow-up 05/10/2024. Note reviewed. MRI L-spine ordered. Prior cardiac testing per primary care in March 2023 including coronary calcium  score of 12.2 (88th percentile) and TTE showing mild LVH, LVEF 55-60^, trivial MR/TR, normal RV systolic function. EKG from June 2025 (done prior to TKA) showed NSR.  She denied chest pain and shortness of breath at PAT RN visit.  Anesthesia team to evaluate on the day of surgery.    VS: BP (P) 124/79    Pulse (!) 106   Temp 36.9 C (Oral)   Resp 18   Ht 5' 6 (1.676 m)   Wt (!) 139.3 kg   SpO2 99%   BMI 49.55 kg/m   PROVIDERS: Thurmond Cathlyn LABOR., MD is PCP. Routine follow-up for healthcare issues on 10/92025 with Melissa Joyce Brown Patram, NP ([facility]). Mai Agent, MD is rheumatologist Caress, Agent, MD is neurologist ([facility]). Last visit noted was on 04/08/2022.     LABS: Labs reviewed: Acceptable for surgery. (all labs ordered are listed, but only abnormal results are displayed)  Labs Reviewed  SURGICAL PCR SCREEN - Abnormal; Notable for the following components:      Result Value   Staphylococcus aureus POSITIVE (*)    All other components within normal limits  BASIC METABOLIC PANEL WITH GFR - Abnormal; Notable for the following components:   Glucose, Bld 113 (*)    All other components within normal limits  CBC  TYPE AND SCREEN     IMAGES: MRI L-spine 05/17/2024: IMPRESSION: 1. Status post left laminectomy and diskectomy at L4-5 with grade 1 anterolisthesis, near complete loss of the disc space, and enhancing granulation tissue in the left lateral recess. 2. Bilateral facet hypertrophic changes at L4-5 contributing to bilateral lateral recess stenosis with questionable impingement of the L5 nerves. 3. Status post remote interbody fusion and bilateral posterolateral fixation at L3-4.    EKG: EKG 5/29/29025 ([facility], scanned under Media tab): Normal sinus rhythm at 82 bpm   CV: CT Cardiac Scoring 10/15/2022 ([facility]): CORONARY CALCIUM  SCORES:  Left Main: 0  LAD: 9.9  LCx: 0  RCA: 2.2  Total Agatston Score: 12.2  MESA database percentile: 88   Impression: 1. Coronary artery calcium  score of 12.2. This places the patient in  the 88th percentile for subjects of the same age, gender and  race/ethnicity who are free of clinical cardiovascular disease and  treated diabetes.    Echo 10/14/2022 [facility]): Conclusions: 1.  Left  ventricular chamber size is normal.  There is mild concentric hypertrophy of the left ventricular wall.  Left ventricular ejection fraction is 55 to 60%. 2.  There is trivial mitral regurgitation. 3.  There is trivial tricuspid valve regurgitation. 4.  The visualized portions of the aortic aorta, ascending aorta and descending aorta are normal in size. 5.  Right ventricular cavity size is normal. 6.  Right ventricular global systolic function is normal.  Past Medical History:  Diagnosis Date   Allergy    Anemia    past hx- none since menopause    Anxiety    Blood transfusion without reported diagnosis    GERD (gastroesophageal reflux disease)    History of hiatal hernia    Hyperlipidemia    Hypertension    Hypothyroidism    Myasthenia gravis    ocular. in remission as of 2025   Psoriatic arthritis [facility])     Past Surgical History:  Procedure Laterality Date   ABDOMINOPLASTY  2016   nicked blood vessel- hematoma- had to have surgery for the hematoma with blood transfusion    BACK SURGERY  2022   L3-L4 decompression and fusion   CESAREAN SECTION  05-15-2004 and 02-17-2006   CHOLECYSTECTOMY  11/2004   CHONDROPLASTY Right 05/06/2022   Procedure: KNEE ARTHROSCOPY CHONDROPLASTY;  Surgeon: Gerome Charleston, MD;  Location: [facility] ORS;  Service: Orthopedics;  Laterality: Right;  knee block   COLONOSCOPY     > 15 yrs ago- normal per pt    gastric sleeve  08/16/2011   KNEE ARTHROSCOPY WITH MEDIAL MENISECTOMY Right 05/06/2022   Procedure: KNEE ARTHROSCOPY WITH PARTIAL MEDIAL MENISECTOMY;  Surgeon: Gerome Charleston, MD;  Location: [facility] ORS;  Service: Orthopedics;  Laterality: Right;  knee block   LASIK Bilateral 2004   MICRODISCECTOMY LUMBAR  2023   L5   THYMECTOMY  2014   Laparoscopic   TOTAL KNEE ARTHROPLASTY  01/24/2024   at [place]   UMBILICAL HERNIA REPAIR  08/16/2011    MEDICATIONS:  celecoxib  (CELEBREX ) 200 MG capsule   acetaminophen  (TYLENOL ) 500 MG tablet   Cyanocobalamin   (B-12 COMPLIANCE INJECTION) 1000 MCG/ML KIT   cyclobenzaprine (FLEXERIL) 10 MG tablet   DULoxetine (CYMBALTA) 60 MG capsule   estradiol  (ESTRACE ) 1 MG tablet   famotidine  (PEPCID ) 20 MG tablet   furosemide (LASIX) 20 MG tablet   gabapentin (NEURONTIN) 300 MG capsule   hydrochlorothiazide  (HYDRODIURIL ) 25 MG tablet   ketoconazole (NIZORAL) 2 % cream   lansoprazole (PREVACID) 30 MG capsule   levothyroxine  (SYNTHROID ) 112 MCG tablet   lisinopril  (ZESTRIL ) 5 MG tablet   loratadine (CLARITIN) 10 MG tablet   methocarbamol  (ROBAXIN ) 500 MG tablet   metoprolol succinate (TOPROL-XL) 25 MG 24 hr tablet   oxyCODONE -acetaminophen  (PERCOCET) 5-325 MG tablet   potassium chloride  (MICRO-K ) 10 MEQ CR capsule   predniSONE  (DELTASONE ) 10 MG tablet   progesterone  (PROMETRIUM ) 100 MG capsule   rosuvastatin  (CRESTOR ) 20 MG tablet   Upadacitinib  ER (RINVOQ ) 15 MG TB24   vitamin C (ASCORBIC ACID) 250 MG tablet   Vitamin D , Ergocalciferol , (DRISDOL ) 1.25 MG (50000 UNIT) CAPS capsule   No current facility-administered medications for  this encounter.    Isaiah Ruder, PA-C Surgical Short Stay/Anesthesiology [facility] Phone [phone] [facility] Phone [phone] 06/06/2024 4:33 PM

## 2024-06-11 ENCOUNTER — Other Ambulatory Visit: Payer: Self-pay | Admitting: Neurosurgery

## 2024-06-14 NOTE — Progress Notes (Signed)
------------------------------------------  [facility] PROCEDURAL EXPEDITER NOTE-------------------------------------------------  Patient Name: Lindsay Hansen Patient DOB: [birth year] Today's Date: @TODAY @   Chart reviewed:  Yes  Documentation gaps: n/a Orders in place:  Yes  Communication with surgical team if no orders: n/a Labs, test, and orders reviewed: yes Requires surgical clearance:  No What type of clearance: n/a Clearance received: n/a Patient status:pre op   Barriers noted:n/a  Intervention provided by [facility] team: n/a Barrier resolved:  Yes   Ronal Bald, RN [employer]

## 2024-06-15 ENCOUNTER — Other Ambulatory Visit: Payer: Self-pay

## 2024-06-15 ENCOUNTER — Observation Stay (HOSPITAL_COMMUNITY)
Admission: RE | Admit: 2024-06-15 | Discharge: 2024-06-16 | Disposition: A | Discharge status: Home / Self Care | Attending: Neurosurgery | Admitting: Neurosurgery

## 2024-06-15 ENCOUNTER — Encounter (HOSPITAL_COMMUNITY): Payer: Self-pay | Admitting: Neurosurgery

## 2024-06-15 ENCOUNTER — Ambulatory Visit (HOSPITAL_COMMUNITY)

## 2024-06-15 ENCOUNTER — Encounter (HOSPITAL_COMMUNITY)
Admission: RE | Disposition: A | Discharge status: Home / Self Care | Payer: Self-pay | Source: Home / Self Care | Attending: Neurosurgery

## 2024-06-15 ENCOUNTER — Ambulatory Visit (HOSPITAL_COMMUNITY): Payer: Self-pay | Admitting: Vascular Surgery

## 2024-06-15 DIAGNOSIS — Z79899 Other long term (current) drug therapy: Secondary | ICD-10-CM | POA: Diagnosis not present

## 2024-06-15 DIAGNOSIS — M4316 Spondylolisthesis, lumbar region: Principal | ICD-10-CM | POA: Diagnosis present

## 2024-06-15 DIAGNOSIS — M48061 Spinal stenosis, lumbar region without neurogenic claudication: Secondary | ICD-10-CM | POA: Insufficient documentation

## 2024-06-15 DIAGNOSIS — E039 Hypothyroidism, unspecified: Secondary | ICD-10-CM | POA: Insufficient documentation

## 2024-06-15 DIAGNOSIS — Z96659 Presence of unspecified artificial knee joint: Secondary | ICD-10-CM | POA: Insufficient documentation

## 2024-06-15 DIAGNOSIS — M5440 Lumbago with sciatica, unspecified side: Secondary | ICD-10-CM | POA: Diagnosis present

## 2024-06-15 DIAGNOSIS — I10 Essential (primary) hypertension: Secondary | ICD-10-CM | POA: Diagnosis not present

## 2024-06-15 SURGERY — POSTERIOR LUMBAR FUSION 1 LEVEL
Anesthesia: General

## 2024-06-15 MED ORDER — MORPHINE SULFATE (PF) 2 MG/ML IV SOLN: 2.0000 mg | INTRAVENOUS | Status: DC | PRN

## 2024-06-15 MED ORDER — DOCUSATE SODIUM 100 MG PO CAPS
100.0000 mg | ORAL_CAPSULE | Freq: Two times a day (BID) | ORAL | Status: DC
  Administered 2024-06-15: 100 mg via ORAL
  Filled 2024-06-15: qty 1

## 2024-06-15 MED ORDER — OXYCODONE HCL 5 MG PO TABS
10.0000 mg | ORAL_TABLET | ORAL | Status: DC | PRN
  Administered 2024-06-15 (×2): 10 mg via ORAL
  Filled 2024-06-15 (×3): qty 2

## 2024-06-15 MED ORDER — AMISULPRIDE (ANTIEMETIC) 5 MG/2ML IV SOLN: 10.0000 mg | Freq: Once | INTRAVENOUS | Status: DC | PRN

## 2024-06-15 MED ORDER — GABAPENTIN 300 MG PO CAPS: 300.0000 mg | ORAL_CAPSULE | Freq: Two times a day (BID) | ORAL | Status: DC | PRN

## 2024-06-15 MED ORDER — ONDANSETRON HCL 4 MG/2ML IJ SOLN: 4.0000 mg | Freq: Once | INTRAMUSCULAR | Status: DC | PRN

## 2024-06-15 MED ORDER — SUGAMMADEX SODIUM 200 MG/2ML IV SOLN
INTRAVENOUS | Status: DC | PRN
  Administered 2024-06-15: 400 mg via INTRAVENOUS

## 2024-06-15 MED ORDER — FENTANYL CITRATE (PF) 100 MCG/2ML IJ SOLN
INTRAMUSCULAR | Status: AC
  Filled 2024-06-15: qty 2

## 2024-06-15 MED ORDER — CHLORHEXIDINE GLUCONATE CLOTH 2 % EX PADS
6.0000 | MEDICATED_PAD | Freq: Every day | CUTANEOUS | Status: DC
  Administered 2024-06-15 – 2024-06-16 (×2): 6 via TOPICAL

## 2024-06-15 MED ORDER — SENNA 8.6 MG PO TABS
1.0000 | ORAL_TABLET | Freq: Two times a day (BID) | ORAL | Status: DC
  Administered 2024-06-15: 8.6 mg via ORAL
  Filled 2024-06-15: qty 1

## 2024-06-15 MED ORDER — SODIUM CHLORIDE 0.9% FLUSH
3.0000 mL | INTRAVENOUS | Status: DC | PRN
Start: 2024-06-15 — End: 2024-06-16

## 2024-06-15 MED ORDER — THROMBIN 5000 UNITS EX KIT
PACK | CUTANEOUS | Status: AC
Start: 2024-06-15 — End: 2024-06-15
  Filled 2024-06-15: qty 1

## 2024-06-15 MED ORDER — CEFAZOLIN SODIUM-DEXTROSE 2-4 GM/100ML-% IV SOLN
2.0000 g | Freq: Three times a day (TID) | INTRAVENOUS | Status: AC
  Administered 2024-06-15 – 2024-06-16 (×2): 2 g via INTRAVENOUS
  Filled 2024-06-15 (×2): qty 100

## 2024-06-15 MED ORDER — METHOCARBAMOL 500 MG PO TABS
ORAL_TABLET | ORAL | Status: AC
Start: 2024-06-15 — End: 2024-06-15
  Filled 2024-06-15: qty 1

## 2024-06-15 MED ORDER — CHLORHEXIDINE GLUCONATE CLOTH 2 % EX PADS: 6.0000 | MEDICATED_PAD | Freq: Once | CUTANEOUS | Status: DC

## 2024-06-15 MED ORDER — SODIUM CHLORIDE 0.9% FLUSH
3.0000 mL | Freq: Two times a day (BID) | INTRAVENOUS | Status: DC
  Administered 2024-06-15 (×2): 3 mL via INTRAVENOUS

## 2024-06-15 MED ORDER — PHENOL 1.4 % MT LIQD: 1.0000 | OROMUCOSAL | Status: DC | PRN

## 2024-06-15 MED ORDER — BUPIVACAINE HCL (PF) 0.5 % IJ SOLN
INTRAMUSCULAR | Status: DC | PRN
  Administered 2024-06-15: 5 mL

## 2024-06-15 MED ORDER — PHENYLEPHRINE HCL-NACL 20-0.9 MG/250ML-% IV SOLN
INTRAVENOUS | Status: DC | PRN
  Administered 2024-06-15: 40 ug/min via INTRAVENOUS

## 2024-06-15 MED ORDER — LISINOPRIL 5 MG PO TABS
5.0000 mg | ORAL_TABLET | Freq: Every day | ORAL | Status: DC
  Administered 2024-06-15: 5 mg via ORAL
  Filled 2024-06-15 (×2): qty 1

## 2024-06-15 MED ORDER — HYDROMORPHONE HCL 1 MG/ML IJ SOLN
INTRAMUSCULAR | Status: AC
  Filled 2024-06-15: qty 0.5

## 2024-06-15 MED ORDER — BACITRACIN ZINC 500 UNIT/GM EX OINT
TOPICAL_OINTMENT | CUTANEOUS | Status: AC
  Filled 2024-06-15: qty 28.35

## 2024-06-15 MED ORDER — PROPOFOL 10 MG/ML IV BOLUS
INTRAVENOUS | Status: DC | PRN
  Administered 2024-06-15: 30 mg via INTRAVENOUS
  Administered 2024-06-15: 100 mg via INTRAVENOUS
  Administered 2024-06-15: 30 mg via INTRAVENOUS

## 2024-06-15 MED ORDER — ESTRADIOL 0.5 MG PO TABS
1.0000 mg | ORAL_TABLET | Freq: Every day | ORAL | Status: DC
  Filled 2024-06-15: qty 2

## 2024-06-15 MED ORDER — METOPROLOL SUCCINATE ER 25 MG PO TB24: 25.0000 mg | ORAL_TABLET | Freq: Every day | ORAL | Status: DC

## 2024-06-15 MED ORDER — SODIUM CHLORIDE 0.9 % IV SOLN: INTRAVENOUS | Status: DC

## 2024-06-15 MED ORDER — OXYCODONE HCL 5 MG PO TABS: 5.0000 mg | ORAL_TABLET | Freq: Once | ORAL | Status: DC | PRN

## 2024-06-15 MED ORDER — DULOXETINE HCL 60 MG PO CPEP: 60.0000 mg | ORAL_CAPSULE | Freq: Every day | ORAL | Status: DC

## 2024-06-15 MED ORDER — 0.9 % SODIUM CHLORIDE (POUR BTL) OPTIME
TOPICAL | Status: DC | PRN
  Administered 2024-06-15: 1000 mL

## 2024-06-15 MED ORDER — LEVOTHYROXINE SODIUM 112 MCG PO TABS
112.0000 ug | ORAL_TABLET | Freq: Every day | ORAL | Status: DC
  Administered 2024-06-16: 112 ug via ORAL
  Filled 2024-06-15: qty 1

## 2024-06-15 MED ORDER — FUROSEMIDE 20 MG PO TABS: 20.0000 mg | ORAL_TABLET | Freq: Every day | ORAL | Status: DC | PRN

## 2024-06-15 MED ORDER — HYDROMORPHONE HCL 1 MG/ML IJ SOLN
INTRAMUSCULAR | Status: DC | PRN
  Administered 2024-06-15 (×2): .5 mg via INTRAVENOUS

## 2024-06-15 MED ORDER — PREDNISONE 10 MG PO TABS
10.0000 mg | ORAL_TABLET | Freq: Every day | ORAL | Status: DC
  Administered 2024-06-16: 10 mg via ORAL
  Filled 2024-06-15: qty 1

## 2024-06-15 MED ORDER — MUPIROCIN 2 % EX OINT: 1.0000 | TOPICAL_OINTMENT | Freq: Two times a day (BID) | CUTANEOUS | 0 refills | Status: AC

## 2024-06-15 MED ORDER — PROGESTERONE MICRONIZED 100 MG PO CAPS
100.0000 mg | ORAL_CAPSULE | Freq: Every day | ORAL | Status: DC
  Filled 2024-06-15: qty 1

## 2024-06-15 MED ORDER — ALBUMIN HUMAN 5 % IV SOLN: INTRAVENOUS | Status: DC | PRN

## 2024-06-15 MED ORDER — ROSUVASTATIN CALCIUM 20 MG PO TABS
20.0000 mg | ORAL_TABLET | Freq: Every day | ORAL | Status: DC
  Administered 2024-06-15: 20 mg via ORAL
  Filled 2024-06-15: qty 1

## 2024-06-15 MED ORDER — SODIUM CHLORIDE 0.9 % IV SOLN: 250.0000 mL | INTRAVENOUS | Status: DC

## 2024-06-15 MED ORDER — OXYCODONE HCL 5 MG PO TABS
5.0000 mg | ORAL_TABLET | ORAL | Status: DC | PRN
  Administered 2024-06-15 – 2024-06-16 (×4): 5 mg via ORAL
  Filled 2024-06-15 (×3): qty 1

## 2024-06-15 MED ORDER — ONDANSETRON HCL 4 MG PO TABS: 4.0000 mg | ORAL_TABLET | Freq: Four times a day (QID) | ORAL | Status: DC | PRN

## 2024-06-15 MED ORDER — MENTHOL 3 MG MT LOZG: 1.0000 | LOZENGE | OROMUCOSAL | Status: DC | PRN

## 2024-06-15 MED ORDER — FENTANYL CITRATE (PF) 250 MCG/5ML IJ SOLN
INTRAMUSCULAR | Status: DC | PRN
Start: 2024-06-15 — End: 2024-06-15
  Administered 2024-06-15 (×2): 50 ug via INTRAVENOUS

## 2024-06-15 MED ORDER — BACITRACIN ZINC 500 UNIT/GM EX OINT
TOPICAL_OINTMENT | CUTANEOUS | Status: DC | PRN
  Administered 2024-06-15: 1 via TOPICAL

## 2024-06-15 MED ORDER — CHLORHEXIDINE GLUCONATE 4 % EX SOLN: 1.0000 | CUTANEOUS | 1 refills | Status: AC

## 2024-06-15 MED ORDER — CHLORHEXIDINE GLUCONATE 0.12 % MT SOLN
15.0000 mL | Freq: Once | OROMUCOSAL | Status: AC
  Administered 2024-06-15: 15 mL via OROMUCOSAL
  Filled 2024-06-15: qty 15

## 2024-06-15 MED ORDER — LACTATED RINGERS IV SOLN: INTRAVENOUS | Status: DC | PRN

## 2024-06-15 MED ORDER — FENTANYL CITRATE (PF) 100 MCG/2ML IJ SOLN
25.0000 ug | INTRAMUSCULAR | Status: DC | PRN
  Administered 2024-06-15 (×2): 25 ug via INTRAVENOUS
  Administered 2024-06-15: 50 ug via INTRAVENOUS

## 2024-06-15 MED ORDER — OXYCODONE HCL 5 MG/5ML PO SOLN: 5.0000 mg | Freq: Once | ORAL | Status: DC | PRN

## 2024-06-15 MED ORDER — MUPIROCIN 2 % EX OINT
1.0000 | TOPICAL_OINTMENT | Freq: Two times a day (BID) | CUTANEOUS | Status: DC
  Administered 2024-06-15 – 2024-06-16 (×2): 1 via NASAL
  Filled 2024-06-15: qty 22

## 2024-06-15 MED ORDER — ACETAMINOPHEN 325 MG PO TABS: 650.0000 mg | ORAL_TABLET | ORAL | Status: DC | PRN

## 2024-06-15 MED ORDER — ROCURONIUM BROMIDE 10 MG/ML (PF) SYRINGE
PREFILLED_SYRINGE | INTRAVENOUS | Status: DC | PRN
  Administered 2024-06-15: 80 mg via INTRAVENOUS

## 2024-06-15 MED ORDER — CYCLOBENZAPRINE HCL 10 MG PO TABS: 10.0000 mg | ORAL_TABLET | Freq: Every day | ORAL | Status: DC

## 2024-06-15 MED ORDER — LIDOCAINE-EPINEPHRINE 1 %-1:100000 IJ SOLN
INTRAMUSCULAR | Status: DC | PRN
  Administered 2024-06-15: 5 mL

## 2024-06-15 MED ORDER — CEFAZOLIN SODIUM 1 G IJ SOLR
INTRAMUSCULAR | Status: AC
Start: 2024-06-15 — End: 2024-06-15
  Filled 2024-06-15: qty 30

## 2024-06-15 MED ORDER — METHOCARBAMOL 500 MG PO TABS
500.0000 mg | ORAL_TABLET | Freq: Four times a day (QID) | ORAL | Status: DC | PRN
  Administered 2024-06-15 – 2024-06-16 (×3): 500 mg via ORAL
  Filled 2024-06-15 (×2): qty 1

## 2024-06-15 MED ORDER — BUPIVACAINE HCL (PF) 0.5 % IJ SOLN
INTRAMUSCULAR | Status: AC
  Filled 2024-06-15: qty 30

## 2024-06-15 MED ORDER — CEFAZOLIN SODIUM-DEXTROSE 3-4 GM/150ML-% IV SOLN
3.0000 g | INTRAVENOUS | Status: AC
  Administered 2024-06-15 (×2): 3 g via INTRAVENOUS
  Filled 2024-06-15: qty 150

## 2024-06-15 MED ORDER — ACETAMINOPHEN 10 MG/ML IV SOLN: 1000.0000 mg | Freq: Once | INTRAVENOUS | Status: DC | PRN

## 2024-06-15 MED ORDER — ONDANSETRON HCL 4 MG/2ML IJ SOLN
INTRAMUSCULAR | Status: DC | PRN
  Administered 2024-06-15: 4 mg via INTRAVENOUS

## 2024-06-15 MED ORDER — LIDOCAINE 2% (20 MG/ML) 5 ML SYRINGE
INTRAMUSCULAR | Status: DC | PRN
  Administered 2024-06-15: 100 mg via INTRAVENOUS

## 2024-06-15 MED ORDER — PHENYLEPHRINE 80 MCG/ML (10ML) SYRINGE FOR IV PUSH (FOR BLOOD PRESSURE SUPPORT)
PREFILLED_SYRINGE | INTRAVENOUS | Status: DC | PRN
  Administered 2024-06-15: 160 ug via INTRAVENOUS
  Administered 2024-06-15 (×2): 80 ug via INTRAVENOUS

## 2024-06-15 MED ORDER — METHOCARBAMOL 1000 MG/10ML IJ SOLN: 500.0000 mg | Freq: Four times a day (QID) | INTRAMUSCULAR | Status: DC | PRN

## 2024-06-15 MED ORDER — FAMOTIDINE 20 MG PO TABS
20.0000 mg | ORAL_TABLET | Freq: Every day | ORAL | Status: DC | PRN
Start: 2024-06-15 — End: 2024-06-16

## 2024-06-15 MED ORDER — LORATADINE 10 MG PO TABS: 10.0000 mg | ORAL_TABLET | Freq: Every day | ORAL | Status: DC | PRN

## 2024-06-15 MED ORDER — POTASSIUM CHLORIDE CRYS ER 10 MEQ PO TBCR
10.0000 meq | EXTENDED_RELEASE_TABLET | Freq: Every day | ORAL | Status: DC
  Administered 2024-06-15: 10 meq via ORAL
  Filled 2024-06-15: qty 1

## 2024-06-15 MED ORDER — PANTOPRAZOLE SODIUM 40 MG PO TBEC: 40.0000 mg | DELAYED_RELEASE_TABLET | Freq: Every day | ORAL | Status: DC

## 2024-06-15 MED ORDER — LIDOCAINE-EPINEPHRINE 1 %-1:100000 IJ SOLN
INTRAMUSCULAR | Status: AC
  Filled 2024-06-15: qty 1

## 2024-06-15 MED ORDER — DEXAMETHASONE SOD PHOSPHATE PF 10 MG/ML IJ SOLN
INTRAMUSCULAR | Status: DC | PRN
  Administered 2024-06-15: 10 mg via INTRAVENOUS

## 2024-06-15 MED ORDER — PROPOFOL 10 MG/ML IV BOLUS
INTRAVENOUS | Status: AC
Start: 2024-06-15 — End: 2024-06-15
  Filled 2024-06-15: qty 20

## 2024-06-15 MED ORDER — HYDROCHLOROTHIAZIDE 25 MG PO TABS
25.0000 mg | ORAL_TABLET | Freq: Every day | ORAL | Status: DC
  Administered 2024-06-15: 25 mg via ORAL
  Filled 2024-06-15: qty 1

## 2024-06-15 MED ORDER — THROMBIN 5000 UNITS EX SOLR
OROMUCOSAL | Status: DC | PRN
  Administered 2024-06-15: 5 mL via TOPICAL

## 2024-06-15 MED ORDER — ORAL CARE MOUTH RINSE: 15.0000 mL | Freq: Once | OROMUCOSAL | Status: AC

## 2024-06-15 MED ORDER — LACTATED RINGERS IV SOLN: INTRAVENOUS | Status: DC

## 2024-06-15 MED ORDER — ONDANSETRON HCL 4 MG/2ML IJ SOLN: 4.0000 mg | Freq: Four times a day (QID) | INTRAMUSCULAR | Status: DC | PRN

## 2024-06-15 MED ORDER — BISACODYL 10 MG RE SUPP: 10.0000 mg | Freq: Every day | RECTAL | Status: DC | PRN

## 2024-06-15 MED ORDER — ACETAMINOPHEN 650 MG RE SUPP: 650.0000 mg | RECTAL | Status: DC | PRN

## 2024-06-15 SURGICAL SUPPLY — 62 items
BAG COUNTER SPONGE SURGICOUNT (BAG) ×1 IMPLANT
BASKET BONE COLLECTION (BASKET) ×1 IMPLANT
BENZOIN TINCTURE PRP APPL 2/3 (GAUZE/BANDAGES/DRESSINGS) IMPLANT
BLADE BONE MILL MEDIUM (MISCELLANEOUS) ×1 IMPLANT
BLADE CLIPPER SURG (BLADE) IMPLANT
BLADE SURG 11 STRL SS (BLADE) ×1 IMPLANT
BUR MATCHSTICK NEURO 3.0 LAGG (BURR) ×1 IMPLANT
BUR PRECISION FLUTE 5.0 (BURR) ×1 IMPLANT
CANISTER SUCTION 3000ML PPV (SUCTIONS) ×1 IMPLANT
CNTNR URN SCR LID CUP LEK RST (MISCELLANEOUS) ×1 IMPLANT
COVER BACK TABLE 60X90IN (DRAPES) ×1 IMPLANT
DERMABOND ADVANCED .7 DNX12 (GAUZE/BANDAGES/DRESSINGS) ×1 IMPLANT
DRAPE C-ARM 42X72 X-RAY (DRAPES) ×1 IMPLANT
DRAPE C-ARMOR (DRAPES) ×1 IMPLANT
DRAPE LAPAROTOMY 100X72X124 (DRAPES) ×1 IMPLANT
DRAPE SURG 17X23 STRL (DRAPES) ×1 IMPLANT
DRESSING PEEL AND PLC PRVNA 13 (GAUZE/BANDAGES/DRESSINGS) IMPLANT
DRESSING PREVENA PLUS CUSTOM (GAUZE/BANDAGES/DRESSINGS) IMPLANT
DRSG OPSITE POSTOP 4X8 (GAUZE/BANDAGES/DRESSINGS) IMPLANT
DURAPREP 26ML APPLICATOR (WOUND CARE) ×1 IMPLANT
ELECTRODE REM PT RTRN 9FT ADLT (ELECTROSURGICAL) ×1 IMPLANT
GAUZE 4X4 16PLY ~~LOC~~+RFID DBL (SPONGE) IMPLANT
GAUZE SPONGE 4X4 12PLY STRL (GAUZE/BANDAGES/DRESSINGS) IMPLANT
GLOVE BIOGEL PI IND STRL 7.5 (GLOVE) ×4 IMPLANT
GLOVE ECLIPSE 7.0 STRL STRAW (GLOVE) ×2 IMPLANT
GLOVE SKINSENSE STRL SZ7.0 (GLOVE) IMPLANT
GOWN STRL REUS W/ TWL LRG LVL3 (GOWN DISPOSABLE) ×4 IMPLANT
GOWN STRL REUS W/ TWL XL LVL3 (GOWN DISPOSABLE) IMPLANT
GOWN STRL REUS W/TWL 2XL LVL3 (GOWN DISPOSABLE) IMPLANT
HEMOSTAT POWDER KIT SURGIFOAM (HEMOSTASIS) ×1 IMPLANT
KIT BASIN OR (CUSTOM PROCEDURE TRAY) ×1 IMPLANT
KIT DRSG PREVENA PLUS 7DAY 125 (MISCELLANEOUS) IMPLANT
KIT INFUSE XX SMALL 0.7CC (Orthopedic Implant) IMPLANT
KIT POSITIONER JACKSON TABLE (MISCELLANEOUS) ×1 IMPLANT
KIT TURNOVER KIT B (KITS) ×1 IMPLANT
MILL BONE PREP (MISCELLANEOUS) IMPLANT
NDL HYPO 18GX1.5 BLUNT FILL (NEEDLE) IMPLANT
NDL HYPO 22X1.5 SAFETY MO (MISCELLANEOUS) ×1 IMPLANT
NDL SPNL 18GX3.5 QUINCKE PK (NEEDLE) IMPLANT
NEEDLE HYPO 18GX1.5 BLUNT FILL (NEEDLE) IMPLANT
NEEDLE HYPO 22X1.5 SAFETY MO (MISCELLANEOUS) ×1 IMPLANT
NEEDLE SPNL 18GX3.5 QUINCKE PK (NEEDLE) IMPLANT
PACK LAMINECTOMY NEURO (CUSTOM PROCEDURE TRAY) ×1 IMPLANT
PAD ARMBOARD POSITIONER FOAM (MISCELLANEOUS) ×3 IMPLANT
PUTTY GRAFTON DBF 6CC W/DELIVE (Putty) IMPLANT
ROD SOLERA 55MM (Rod) IMPLANT
SCREW SET SOLERA TI (Screw) IMPLANT
SCREW SOLERA 40X6.5XMA NS SPNE (Screw) IMPLANT
SOLN 0.9% NACL POUR BTL 1000ML (IV SOLUTION) ×1 IMPLANT
SOLN STERILE WATER BTL 1000 ML (IV SOLUTION) ×1 IMPLANT
SPACER ARTIC-L 12X30X8 5D (Spacer) IMPLANT
SPIKE FLUID TRANSFER (MISCELLANEOUS) ×1 IMPLANT
SPONGE SURGIFOAM ABS GEL 100 (HEMOSTASIS) IMPLANT
SPONGE T-LAP 4X18 ~~LOC~~+RFID (SPONGE) IMPLANT
STAPLER SKIN PROX 35W (STAPLE) IMPLANT
STRIP CLOSURE SKIN 1/2X4 (GAUZE/BANDAGES/DRESSINGS) IMPLANT
SUT VIC AB 0 CT1 18XCR BRD8 (SUTURE) ×1 IMPLANT
SUT VICRYL 3-0 RB1 18 ABS (SUTURE) ×1 IMPLANT
TOWEL GREEN STERILE (TOWEL DISPOSABLE) ×1 IMPLANT
TOWEL GREEN STERILE FF (TOWEL DISPOSABLE) ×1 IMPLANT
TRAY FOLEY MTR SLVR 14FR STAT (SET/KITS/TRAYS/PACK) IMPLANT
TRAY FOLEY MTR SLVR 16FR STAT (SET/KITS/TRAYS/PACK) ×1 IMPLANT

## 2024-06-15 NOTE — Progress Notes (Signed)
 Orthopedic Tech Progress Note Patient Details:  Lindsay Hansen 15-Dec-1970 [id number]  Ortho Devices Type of Ortho Device: Lumbar corsett Ortho Device/Splint Location: Back Ortho Device/Splint Interventions: Ordered, Application, Adjustment   Post Interventions Patient Tolerated: Well  Lindsay Hansen A Obie Kallenbach 06/15/2024, 3:14 PM

## 2024-06-15 NOTE — Anesthesia Procedure Notes (Signed)
 Procedure Name: Intubation Date/Time: 06/15/2024 8:27 AM  Performed by: Scherrie Mast, CRNAPre-anesthesia Checklist: Patient identified, Emergency Drugs available, Suction available and Patient being monitored Patient Re-evaluated:Patient Re-evaluated prior to induction Oxygen Delivery Method: Circle System Utilized Preoxygenation: Pre-oxygenation with 100% oxygen Induction Type: IV induction Ventilation: Mask ventilation without difficulty Laryngoscope Size: Glidescope, Mac and 3 Grade View: Grade I Tube type: Oral Tube size: 7.0 mm Number of attempts: 1 Airway Equipment and Method: Stylet and Oral airway Placement Confirmation: ETT inserted through vocal cords under direct vision, positive ETCO2 and breath sounds checked- equal and bilateral Secured at: 22 cm Tube secured with: Tape Dental Injury: Teeth and Oropharynx as per pre-operative assessment

## 2024-06-15 NOTE — Plan of Care (Signed)

## 2024-06-15 NOTE — Transfer of Care (Signed)
 Immediate Anesthesia Transfer of Care Note  Patient: Lindsay Hansen  Procedure(s) Performed: POSTERIOR LUMBAR INTERBODY FUSION LUMBAR FOUR-LUMBAR FIVE EXTEND FUSION  LUMBAR THREE TO LUMBAR  FIVE  Patient Location: PACU  Anesthesia Type:General  Level of Consciousness: awake, alert , and oriented  Airway & Oxygen Therapy: Patient Spontanous Breathing and Patient connected to nasal cannula oxygen  Post-op Assessment: Report given to RN and Post -op Vital signs reviewed and stable  Post vital signs: Reviewed and stable  Last Vitals:  Vitals Value Taken Time  BP 78/42 06/15/24 12:00  Temp    Pulse 115 06/15/24 12:02  Resp 20 06/15/24 12:02  SpO2 91 % 06/15/24 12:02  Vitals shown include unfiled device data.  Last Pain:  Vitals:   06/15/24 9367  TempSrc:   PainSc: 5          Complications: No notable events documented.

## 2024-06-15 NOTE — H&P (Signed)
 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 Since then she has been dealing with bouts of primarily right-sided back and leg pain although her symptoms have significantly worsened over the last few months. She is now complaining of significant left-sided symptoms as well, although the right remained worse. She describes pain across her lower back, especially a tightness which prevents her from standing upright for at least 20-30 minutes in the morning. She has pain radiating through the posterolateral aspect of the right leg and calf, and involving her foot. Again right side is worse than the left. She has been getting periodic transforaminal epidural steroid injections primarily on the right side at L4-5 and L5-S1, the last being in March which lasted for about two months. Since then, she has also undergone a right knee replacement and has actually done remarkably well since then, with no further knee pain. 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   Past Medical History   Past Medical History:  Diagnosis Date   Allergy    Anemia    past hx- none since menopause    Anxiety    Blood transfusion without reported diagnosis    GERD (gastroesophageal reflux disease)    History of hiatal hernia    Hyperlipidemia    Hypertension    Hypothyroidism    Myasthenia gravis    ocular. in remission as of 2025   Psoriatic arthritis [facility])     Past Surgical History   Past Surgical History:  Procedure Laterality Date   ABDOMINOPLASTY  2016   nicked blood vessel- hematoma- had to have surgery for the hematoma with blood transfusion    BACK SURGERY  2022   L3-L4 decompression and fusion   CESAREAN SECTION   05-15-2004 and 02-17-2006   CHOLECYSTECTOMY  11/2004   CHONDROPLASTY Right 05/06/2022   Procedure: KNEE ARTHROSCOPY CHONDROPLASTY;  Surgeon: Gerome Charleston, MD;  Location: [facility];  Service: Orthopedics;  Laterality: Right;  knee block   COLONOSCOPY     > 15 yrs ago- normal per pt    gastric sleeve  08/16/2011   KNEE ARTHROSCOPY WITH MEDIAL MENISECTOMY Right 05/06/2022   Procedure: KNEE ARTHROSCOPY WITH PARTIAL MEDIAL MENISECTOMY;  Surgeon: Gerome Charleston, MD;  Location: [facility];  Service: Orthopedics;  Laterality: Right;  knee block   LASIK Bilateral 2004   MICRODISCECTOMY LUMBAR  2023   L5   THYMECTOMY  2014   Laparoscopic   TOTAL KNEE ARTHROPLASTY  01/24/2024   at [facility]   UMBILICAL HERNIA REPAIR  08/16/2011    Social History   Social History   Tobacco Use   Smoking status: Never   Smokeless tobacco: Never  Vaping Use   Vaping status: Never Used  Substance Use Topics   Alcohol use: Yes    Alcohol/week: 1.0 standard drink of alcohol    Types: 1 Standard drinks or equivalent per week    Comment: Occasionally   Drug use: No    Medications   Prior to Admission medications   Medication Sig Start Date End Date Taking? Authorizing Provider  acetaminophen  (TYLENOL ) 500 MG tablet Take 1,000 mg by mouth every 8 (eight) hours as needed for moderate pain.   Yes [provider]  celecoxib  (CELEBREX )  200 MG capsule Take 200 mg by mouth daily.   Yes [provider]  Cyanocobalamin  (B-12 COMPLIANCE INJECTION) 1000 MCG/ML KIT Inject 1,000 mcg into the muscle every 30 (thirty) days.   Yes [provider]  cyclobenzaprine (FLEXERIL) 10 MG tablet Take 10 mg by mouth at bedtime. 05/10/24  Yes [provider]  DULoxetine (CYMBALTA) 60 MG capsule Take 60 mg by mouth daily.   Yes [provider]  estradiol  (ESTRACE ) 1 MG tablet Take 1 mg by mouth daily.   Yes [provider]  famotidine  (PEPCID ) 20 MG tablet Take 20 mg by mouth daily as  needed for heartburn or indigestion.   Yes [provider]  furosemide (LASIX) 20 MG tablet Take 20 mg by mouth daily as needed for edema. 07/23/22  Yes [provider]  gabapentin (NEURONTIN) 300 MG capsule Take 300 mg by mouth 2 (two) times daily as needed (pain).   Yes [provider]  hydrochlorothiazide  (HYDRODIURIL ) 25 MG tablet Take 25 mg by mouth daily. 05/03/20  Yes [provider]  ketoconazole (NIZORAL) 2 % cream Apply 1 Application topically at bedtime. 05/11/24  Yes [provider]  lansoprazole (PREVACID) 30 MG capsule Take 30 mg by mouth daily.   Yes [provider]  levothyroxine  (SYNTHROID ) 112 MCG tablet Take 112 mcg by mouth daily before breakfast.   Yes [provider]  lisinopril  (ZESTRIL ) 5 MG tablet Take 5 mg by mouth daily.   Yes [provider]  loratadine (CLARITIN) 10 MG tablet Take 10 mg by mouth daily as needed for allergies.   Yes [provider]  metoprolol succinate (TOPROL-XL) 25 MG 24 hr tablet Take 25 mg by mouth daily.   Yes [provider]  oxyCODONE -acetaminophen  (PERCOCET) 5-325 MG tablet 2 tablets every 4 hours as needed 05/05/22  Yes Sofia, Leslie K, PA-C  potassium chloride  (MICRO-K ) 10 MEQ CR capsule Take 10 mEq by mouth daily. 05/20/21  Yes [provider]  predniSONE  (DELTASONE ) 10 MG tablet Take 10 mg by mouth daily with breakfast. 08/10/11  Yes [provider]  progesterone  (PROMETRIUM ) 100 MG capsule Take 100 mg by mouth daily.   Yes [provider]  rosuvastatin  (CRESTOR ) 20 MG tablet Take 20 mg by mouth daily.   Yes [provider]  Upadacitinib  ER (RINVOQ ) 15 MG TB24 Take 15 mg by mouth daily.   Yes [provider]  vitamin C (ASCORBIC ACID) 250 MG tablet Take 250 mg by mouth daily.   Yes [provider]  Vitamin D , Ergocalciferol , (DRISDOL ) 1.25 MG (50000 UNIT) CAPS capsule Take 50,000 Units by mouth every  Sunday. 07/17/20  Yes [provider]  methocarbamol  (ROBAXIN ) 500 MG tablet Take 1 tablet (500 mg total) by mouth every 8 (eight) hours as needed for muscle spasms. Patient not taking: Reported on 05/31/2024 06/18/21   Lindsay Pupa, MD    Allergies  No Known Allergies  Review of Systems  ROS  Neurologic Exam  Awake, alert, oriented Memory and concentration grossly intact Speech fluent, appropriate CN grossly intact Motor exam: Upper Extremities Deltoid Bicep Tricep Grip  Right 5/5 5/5 5/5 5/5  Left 5/5 5/5 5/5 5/5   Lower Extremities IP Quad PF DF EHL  Right 5/5 5/5 5/5 5/5 5/5  Left 5/5 5/5 5/5 5/5 5/5   Sensation grossly intact to LT  Imaging  MRI reveals L4-5 disc disease, facet arthropathy, and associated lateral recess and foraminal stenosis  Impression  - 53 y.o.  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  Plan  - Will proceed with decompression and extension of fusion, L4-5  I have reviewed the indications for the procedure as well as the details of the procedure and the expected postoperative course and recovery at length with the patient in the office. We have also reviewed in detail the risks, benefits, and alternatives to the procedure. All questions were answered and Lindsay Hansen provided informed consent to proceed.  Gerldine Maizes, MD [facility]

## 2024-06-15 NOTE — Op Note (Signed)
 NEUROSURGERY OPERATIVE NOTE   PREOP DIAGNOSIS:  Spondylolisthesis, L4-5 Adjacent level stenosis, L4-5  POSTOP DIAGNOSIS: Same  PROCEDURE: 1. L4 laminectomy with facetectomy for decompression of exiting nerve roots, more than would be required for placement of interbody graft 2. Placement of anterior interbody device - [employer] banana cage 8mm x 30mm 5 degree 3. Posterior segmental instrumentation using cortical pedicle screws at L3 - L5 4. Removal of previous set screws/rod 5. Interbody arthrodesis, L4-5 6. Use of locally harvested bone autograft 7. Use of non-structural bone allograft - BMP, DBM  SURGEON: Dr. Gerldine Maizes, MD  ASSISTANT: None  ANESTHESIA: General Endotracheal  EBL: 100cc  SPECIMENS: None  DRAINS: None  COMPLICATIONS: None immediate  CONDITION: Hemodynamically stable to PACU  HISTORY: Lindsay Hansen is a 53 y.o. 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 Multiple conservative treatments were attempted without significant improvement and we ultimately elected to proceed with surgical decompression and extension fusion. Risks, benefits, and alternative treatments were reviewed in detail in the office. After all questions were answered, informed consent was obtained and witnessed.  PROCEDURE IN DETAIL: The patient was brought to the operating room via stretcher. After induction of general anesthesia, the patient was positioned on the operative table in the prone position. All pressure points were meticulously padded.  The previous incision was then marked out and prepped and draped in the usual sterile fashion.  After timeout was conducted, skin was infiltrated with local anesthetic. Skin incision was then made sharply and Bovie electrocautery was used to dissect the subcutaneous tissue until the lumbodorsal  fascia was identified and incised. Spinous process at L2 and L4 were identified.  Subperiosteal dissection was then carried out along the inferior aspect of the L2 lamina and the previously placed L3 pedicle screw was then identified.  Dissection was then carried inferiorly to identify the intervening rod in the L4 pedicle screw.  Subperiosteal dissection was also carried out along the L4 lamina.  The L4-5 facet complex was identified.  Self-retaining retractors were then placed.   Initially, the previous setscrews at L3 and L4 removed.  The previously placed rod was removed. At this point attention was turned to decompression. Complete L4 laminectomy was completed with a high-speed drill and Kerrison punches.  Normal dura was identified.  A ball-tipped dissector was then used to identify the foramina bilaterally.  High-speed drill was used to cut across the pars interarticularis and the inferior articulating process of L4 was removed bilaterally.  On the right side, there was no significant epidural adhesions.  The medial and superior aspect of the L5 superior articulating process was removed with Kerrison punches.  This allowed complete decompression of the right lateral recess and foramen including the exiting L4 and traversing L5 nerve roots.  There was some epidural fibrosis on the left side however I was able to remove the medial and superior aspect of the L5 superior articulating process.  I then identified a relatively large disc herniation on the left side which appeared to be tenting up the thecal sac at the level of the shoulder of the L5 nerve root.  The Kerrison punches were used to completely unroofed the left L4-5 foramen as well.  At this point, the disc space was then incised on the left, and using a combination of shavers, curettes and rongeurs, complete discectomy was completed.  This included  use of large Epstein curettes in order to remove the left eccentric slightly superiorly migrated disc  herniation compressing the lateral aspect of the thecal sac.  Once this was done, I was able to easily pass a large ball dissector within the ventral epidural space from the left to the contralateral side indicating good decompression of the thecal sac.  Using a trial, an 8 mm x 30 mm cage was selected with 5 degrees of lordosis.  This was tapped into place using AP and lateral fluoroscopy. Bone harvested during decompression was mixed with BMP and DBM and packed into the interspace.   At this point, the entry points for bilateral L5 cortical pedicle screws were identified using standard anatomic landmarks and lateral fluoro. Pilot holes were then drilled and tapped to 5.5 x 40 mm. Screws were then placed in L5 bilaterally measuring 6.5 x 40 mm. Prebent 55 mm lordotic rod was then sized and placed into the pedicle screws from L3-L5. Set screws were placed and final tightened. Final AP and lateral fluoroscopic images confirmed good position.  Hemostasis was secured and confirmed with bipolar cautery and morcellized gelfoam with thrombin . The wound was then irrigated with copious amounts of antibiotic saline, then closed in standard fashion using a combination of interrupted 0 and 3-0 Vicryl stitches in the muscular, fascial, and subcutaneous layers. Skin was then closed using standard Dermabond. Sterile dressing was then applied. The patient was then transferred to the stretcher, extubated, and taken to the postanesthesia care unit in stable hemodynamic condition.  At the end of the case all sponge, needle, cottonoid, and instrument counts were correct.   Gerldine Maizes, MD [facility]

## 2024-06-16 DIAGNOSIS — M4316 Spondylolisthesis, lumbar region: Secondary | ICD-10-CM | POA: Diagnosis not present

## 2024-06-16 MED ORDER — OXYCODONE-ACETAMINOPHEN 5-325 MG PO TABS: ORAL_TABLET | ORAL | 0 refills | Status: DC

## 2024-06-16 MED ORDER — CYCLOBENZAPRINE HCL 10 MG PO TABS: 10.0000 mg | ORAL_TABLET | Freq: Every day | ORAL | 0 refills | Status: AC

## 2024-06-16 NOTE — Evaluation (Addendum)
 Physical Therapy Evaluation and Discharge Patient Details Name: Lindsay Hansen MRN: [id number] DOB: 26-Oct-1970 Today's Date: 06/16/2024  History of Present Illness  Pt is a 53 y.o. female s/p PLIF. PMH: anemia, GERD, anxiety, HTN, myasthenia gravis, L3-4 fusion (2022)  Clinical Impression  PT eval complete. PTA pt independent without AD. On eval, she required min assist bed mobility, supervision transfers, supervision amb 150' with RW, and CGA ascend/descend 2 steps with L rail. Pt plans to initially reside of main level of house. Educated on 3/3 back precautions. Family able to provide needed level of assist. No further skilled PT intervention indicated. PT signing off.        If plan is discharge home, recommend the following: A little help with bathing/dressing/bathroom;Help with stairs or ramp for entrance;Assistance with cooking/housework   Can travel by private vehicle        Equipment Recommendations None recommended by PT  Recommendations for Other Services       Functional Status Assessment Patient has had a recent decline in their functional status and demonstrates the ability to make significant improvements in function in a reasonable and predictable amount of time.     Precautions / Restrictions Precautions Precautions: Back Recall of Precautions/Restrictions: Intact Precaution/Restrictions Comments: Educated on 3/3 back precautions. Required Braces or Orthoses: Spinal Brace Spinal Brace: Lumbar corset;Applied in sitting position      Mobility  Bed Mobility Overal bed mobility: Needs Assistance Bed Mobility: Rolling, Sidelying to Sit Rolling: Modified independent (Device/Increase time) Sidelying to sit: Min assist       General bed mobility comments: cues for logroll, assist to elevate trunk    Transfers Overall transfer level: Needs assistance Equipment used: Rolling walker (2 wheels) Transfers: Sit to/from Stand Sit to Stand: Supervision                 Ambulation/Gait Ambulation/Gait assistance: Supervision Gait Distance (Feet): 150 Feet Assistive device: Rolling walker (2 wheels) Gait Pattern/deviations: Step-through pattern, Decreased stride length, Antalgic Gait velocity: decreased Gait velocity interpretation: <1.31 ft/sec, indicative of household ambulator      Stairs Stairs: Yes Stairs assistance: Contact guard assist Stair Management: One rail Left, Step to pattern, Forwards, Backwards Number of Stairs: 2 General stair comments: ascend forward, descend backward  Wheelchair Mobility     Tilt Bed    Modified Rankin (Stroke Patients Only)       Balance Overall balance assessment: Needs assistance Sitting-balance support: No upper extremity supported, Feet supported Sitting balance-Leahy Scale: Good     Standing balance support: Bilateral upper extremity supported, No upper extremity supported, During functional activity Standing balance-Leahy Scale: Fair Standing balance comment: RW for increased amb                             Pertinent Vitals/Pain Pain Assessment Pain Assessment: 0-10 Pain Score: 3  Pain Location: back Pain Descriptors / Indicators: Grimacing, Guarding, Spasm Pain Intervention(s): Monitored during session    Home Living Family/patient expects to be discharged to:: Private residence Living Arrangements: Spouse/significant other;Children Available Help at Discharge: Family;Available 24 hours/day Type of Home: House Home Access: Stairs to enter Entrance Stairs-Rails: Right Entrance Stairs-Number of Steps: 2 Alternate Level Stairs-Number of Steps: flight Home Layout: Two level;Bed/bath upstairs;1/2 bath on main level Home Equipment: Hand held shower head;Rolling Walker (2 wheels);BSC/3in1;Shower seat Additional Comments: Pt plans to initially reside on main level: recliner, twin bed and will sponge bathe.    Prior Function Prior  Level of Function :  Independent/Modified Independent;Working/employed;Driving             Mobility Comments: no AD at baseline ADLs Comments: ind     Extremity/Trunk Assessment   Upper Extremity Assessment Upper Extremity Assessment: Generalized weakness    Lower Extremity Assessment Lower Extremity Assessment: Defer to PT evaluation    Cervical / Trunk Assessment Cervical / Trunk Assessment: Back Surgery  Communication   Communication Communication: No apparent difficulties    Cognition Arousal: Alert Behavior During Therapy: WFL for tasks assessed/performed   PT - Cognitive impairments: No apparent impairments                         Following commands: Intact       Cueing Cueing Techniques: Verbal cues     General Comments General comments (skin integrity, edema, etc.): VSS on RA    Exercises     Assessment/Plan    PT Assessment Patient does not need any further PT services  PT Problem List         PT Treatment Interventions      PT Goals (Current goals can be found in the Care Plan section)  Acute Rehab PT Goals Patient Stated Goal: home PT Goal Formulation: All assessment and education complete, DC therapy    Frequency       Co-evaluation               AM-PAC PT 6 Clicks Mobility  Outcome Measure Help needed turning from your back to your side while in a flat bed without using bedrails?: None Help needed moving from lying on your back to sitting on the side of a flat bed without using bedrails?: A Little Help needed moving to and from a bed to a chair (including a wheelchair)?: A Little Help needed standing up from a chair using your arms (e.g., wheelchair or bedside chair)?: A Little Help needed to walk in hospital room?: A Little Help needed climbing 3-5 steps with a railing? : A Little 6 Click Score: 19    End of Session Equipment Utilized During Treatment: Back brace Activity Tolerance: Patient tolerated treatment well Patient  left: in bed;with call bell/phone within reach Nurse Communication: Mobility status PT Visit Diagnosis: Other abnormalities of gait and mobility (R26.89);Pain    Time: 9254-9196 PT Time Calculation (min) (ACUTE ONLY): 18 min   Charges:   PT Evaluation $PT Eval Moderate Complexity: 1 Mod   PT General Charges $$ ACUTE PT VISIT: 1 Visit         Sari MATSU., PT  Office # [phone]   Erven Sari Shaker 06/16/2024, 10:31 AM

## 2024-06-16 NOTE — Evaluation (Signed)
 Occupational Therapy Evaluation Patient Details Name: Lindsay Hansen MRN: [id number] DOB: 1971/01/16 Today's Date: 06/16/2024   History of Present Illness   Pt is a 53 y.o. female s/p PLIF. PMH: anemia, GERD, anxiety, HTN, myasthenia gravis, L3-4 fusion (2022)     Clinical Impressions Pt ind at baseline with ADLs/functional mobility, lives with spouse who can assist at d/c. Pt currently needs up to [facility] for ADLs, supervision for transfers with RW. Pt educated on back precautions, brace wear, and compensatory strategies for ADLs/mobility. Pt presenting with impairments listed below, however has no further acute OT needs at this time, will s/o. Please reconsult if there is a change in pt status. Anticipate no OT follow up needs at d/c.     If plan is discharge home, recommend the following:   A little help with walking and/or transfers;A little help with bathing/dressing/bathroom;Assistance with cooking/housework;Assist for transportation     Functional Status Assessment   Patient has had a recent decline in their functional status and demonstrates the ability to make significant improvements in function in a reasonable and predictable amount of time.     Equipment Recommendations   None recommended by OT     Recommendations for Other Services   PT consult     Precautions/Restrictions   Precautions Precautions: Back Precaution Booklet Issued: Yes (comment) Recall of Precautions/Restrictions: Intact Precaution/Restrictions Comments: Educated on 3/3 back precautions. Required Braces or Orthoses: Spinal Brace Spinal Brace: Lumbar corset;Applied in sitting position Restrictions Weight Bearing Restrictions Per Provider Order: No     Mobility Bed Mobility               General bed mobility comments: pt EOB upon arrival and departure, able to verbalize log roll technique    Transfers Overall transfer level: Needs assistance Equipment used: Rolling walker (2  wheels) Transfers: Sit to/from Stand Sit to Stand: Supervision                  Balance Overall balance assessment: Needs assistance Sitting-balance support: No upper extremity supported, Feet supported Sitting balance-Leahy Scale: Good     Standing balance support: Bilateral upper extremity supported, No upper extremity supported, During functional activity Standing balance-Leahy Scale: Fair Standing balance comment: RW for increased amb                           ADL either performed or assessed with clinical judgement   ADL Overall ADL's : Needs assistance/impaired Eating/Feeding: Set up;Sitting   Grooming: Set up;Wash/dry hands;Standing   Upper Body Bathing: Supervision/ safety   Lower Body Bathing: Contact guard assist   Upper Body Dressing : Supervision/safety   Lower Body Dressing: Contact guard assist   Toilet Transfer: Supervision/safety;Ambulation;Rolling walker (2 wheels);Regular Toilet   Toileting- Clothing Manipulation and Hygiene: Supervision/safety       Functional mobility during ADLs: Supervision/safety;Rolling walker (2 wheels)       Vision   Vision Assessment?: No apparent visual deficits     Perception Perception: Not tested       Praxis Praxis: Not tested       Pertinent Vitals/Pain Pain Assessment Pain Assessment: No/denies pain     Extremity/Trunk Assessment Upper Extremity Assessment Upper Extremity Assessment: Generalized weakness   Lower Extremity Assessment Lower Extremity Assessment: Defer to PT evaluation   Cervical / Trunk Assessment Cervical / Trunk Assessment: Back Surgery   Communication Communication Communication: No apparent difficulties   Cognition Arousal: Alert Behavior During Therapy: [facility] for  tasks assessed/performed Cognition: No apparent impairments             OT - Cognition Comments: good understanding of back precautions and compensatory strategies as pt has had prior back  surgery                 Following commands: Intact       Cueing  General Comments   Cueing Techniques: Verbal cues  VSS on RA   Exercises     Shoulder Instructions      Home Living Family/patient expects to be discharged to:: Private residence Living Arrangements: Spouse/significant other;Children Available Help at Discharge: Family;Available 24 hours/day Type of Home: House Home Access: Stairs to enter [employer] of Steps: 2 Entrance Stairs-Rails: Right Home Layout: Two level;Bed/bath upstairs;1/2 bath on main level Alternate Level Stairs-Number of Steps: flight   Bathroom Shower/Tub: Producer, Television/film/video: Standard     Home Equipment: Hand held Programmer, Systems (2 wheels);BSC/3in1;Shower seat   Additional Comments: Pt plans to initially reside on main level: recliner, twin bed and will sponge bathe.      Prior Functioning/Environment Prior Level of Function : Independent/Modified Independent;Working/employed;Driving             Mobility Comments: no AD at baseline ADLs Comments: ind    OT Problem List: Decreased strength;Decreased range of motion;Decreased activity tolerance;Impaired balance (sitting and/or standing)   OT Treatment/Interventions:        OT Goals(Current goals can be found in the care plan section)   Acute Rehab OT Goals Patient Stated Goal: none stated OT Goal Formulation: With patient Time For Goal Achievement: 06/30/24 Potential to Achieve Goals: Good   OT Frequency:       Co-evaluation              AM-PAC OT 6 Clicks Daily Activity     Outcome Measure Help from another person eating meals?: A Little Help from another person taking care of personal grooming?: A Little Help from another person toileting, which includes using toliet, bedpan, or urinal?: A Little Help from another person bathing (including washing, rinsing, drying)?: A Little Help from another person to put on  and taking off regular upper body clothing?: A Little Help from another person to put on and taking off regular lower body clothing?: A Little 6 Click Score: 18   End of Session Equipment Utilized During Treatment: Rolling walker (2 wheels);Back brace Nurse Communication: Mobility status  Activity Tolerance: Patient tolerated treatment well Patient left: in bed;with call bell/phone within reach  OT Visit Diagnosis: Unsteadiness on feet (R26.81);Other abnormalities of gait and mobility (R26.89);Muscle weakness (generalized) (M62.81)                Time: 9197-9176 OT Time Calculation (min): 21 min Charges:  OT General Charges $OT Visit: 1 Visit OT Evaluation $OT Eval Low Complexity: 1 Low  Claris Pech K, OTD, OTR/L SecureChat Preferred Acute Rehab [phone]   Darvell Monteforte K Koonce 06/16/2024, 9:18 AM

## 2024-06-16 NOTE — Progress Notes (Signed)
 Patient is discharged from room 3C02 at this time. Alert and in stable condition. Wound Vac in place, IV site d/c'd and instructions read to patient and spouse with understanding verbalized and all questions answered. Left unit via wheelchair with all belongings at side.

## 2024-06-16 NOTE — Discharge Instructions (Signed)
Wound Care Keep incision covered and dry for two days.   Do not put any creams, lotions, or ointments on incision. Leave steri-strips on back.  They will fall off by themselves. Activity Walk each and every day, increasing distance each day. No lifting greater than 5 lbs.  Avoid bending, lifting and twisting. No driving for 2 weeks; may ride as a passenger locally. If provided with back brace, wear when out of bed.  It is not necessary to wear brace in bed. Diet Resume your normal diet.  Return to Work Will be discussed at you follow up appointment. Call Your Doctor If Any of These Occur Redness, drainage, or swelling at the wound.  Temperature greater than 101 degrees. Severe pain not relieved by pain medication. Incision starts to come apart. Follow Up Appt Call today for appointment in 1-2 weeks [phone]) or for problems.  If you have any hardware placed in your spine, you will need an x-ray before your appointment.  

## 2024-06-16 NOTE — Care Management (Signed)
 Patient with order to DC to home today. Unit staff to provide DME needed for home.   No HH needs identified Patient will have family/ friends provide transportation home. No other TOC needs identified for DC

## 2024-06-16 NOTE — Discharge Summary (Signed)
 Physician Discharge Summary  Patient ID: Lindsay Hansen MRN: [id number] DOB/AGE: 1971/06/28 53 y.o.  Admit date: 06/15/2024 Discharge date: 06/16/2024  Admission Diagnoses: Spondylolisthesis, L4-5 Adjacent level stenosis, L4-5    Discharge Diagnoses: same   Discharged Condition: good  Hospital Course: The patient was admitted on 06/15/2024 and taken to the operating room where the patient underwent lumbar fusion L4-5. The patient tolerated the procedure well and was taken to the recovery room and then to the floor in stable condition. The hospital course was routine. There were no complications. The wound remained clean dry and intact. Pt had appropriate back soreness. No complaints of leg pain or new N/T/W. The patient remained afebrile with stable vital signs, and tolerated a regular diet. The patient continued to increase activities, and pain was well controlled with oral pain medications.   Consults: None  Significant Diagnostic Studies:  Results for orders placed or performed during the hospital encounter of 06/05/24  Surgical pcr screen   Collection Time: 06/05/24  2:16 PM   Specimen: Nasal Mucosa; Nasal Swab  Result Value Ref Range   MRSA, PCR NEGATIVE NEGATIVE   Staphylococcus aureus POSITIVE (A) NEGATIVE  Type and screen   Collection Time: 06/05/24  2:33 PM  Result Value Ref Range   ABO/RH(D) O POS    Antibody Screen NEG    Sample Expiration 06/19/2024,2359    Extend sample reason      NO TRANSFUSIONS OR PREGNANCY IN THE PAST 3 MONTHS Performed at [facility], [address] [place], [place] [address]   Basic metabolic panel per protocol   Collection Time: 06/05/24  3:00 PM  Result Value Ref Range   Sodium 137 135 - 145 mmol/L   Potassium 3.5 3.5 - 5.1 mmol/L   Chloride 101 98 - 111 mmol/L   CO2 23 22 - 32 mmol/L   Glucose, Bld 113 (H) 70 - 99 mg/dL   BUN 13 6 - 20 mg/dL   Creatinine, Ser 9.07 0.44 - 1.00 mg/dL   Calcium  9.3 8.9 - 10.3 mg/dL   GFR,  Estimated >39 >39 mL/min   Anion gap 13 5 - 15  CBC per protocol   Collection Time: 06/05/24  3:00 PM  Result Value Ref Range   WBC 10.1 4.0 - 10.5 K/uL   RBC 4.21 3.87 - 5.11 MIL/uL   Hemoglobin 13.1 12.0 - 15.0 g/dL   HCT 60.5 63.9 - 53.9 %   MCV 93.6 80.0 - 100.0 fL   MCH 31.1 26.0 - 34.0 pg   MCHC 33.2 30.0 - 36.0 g/dL   RDW 86.7 88.4 - 84.4 %   Platelets 340 150 - 400 K/uL   nRBC 0.0 0.0 - 0.2 %    DG Lumbar Spine 2-3 Views Result Date: 06/15/2024 CLINICAL DATA:  Elective surgery. EXAM: LUMBAR SPINE - 2-3 VIEW COMPARISON:  Preoperative imaging FINDINGS: Two fluoroscopic spot views of the lumbar spine submitted from the operating room. Previous fusion at the L3-L4 level. Subsequent fusion extends to L5 with new L4-L5 interbody spacer. Fluoroscopy time 1 minutes 6 seconds. Dose 126.65 mGy. IMPRESSION: Intraoperative fluoroscopy during lumbar fusion. Electronically Signed   By: Andrea Gasman M.D.   On: 06/15/2024 11:59   DG C-Arm 1-60 Min-No Report Result Date: 06/15/2024 Fluoroscopy was utilized by the requesting physician.  No radiographic interpretation.   DG C-Arm 1-60 Min-No Report Result Date: 06/15/2024 Fluoroscopy was utilized by the requesting physician.  No radiographic interpretation.   MR Lumbar Spine W Wo  Contrast Result Date: 05/20/2024 EXAM: MR Lumbar Spine with and without intravenous contrast. 05/17/2024 01:24:00 PM TECHNIQUE: Multiplanar multisequence MRI of the lumbar spine was performed with and without the administration of 10mL Gadobutrol (GADAVIST) intravenous contrast. COMPARISON: MRI of the lumbar spine dated 05/05/2022. CLINICAL HISTORY: FINDINGS: BONES AND ALIGNMENT: Normal vertebral body heights. Normal bone marrow signal. No abnormal enhancement. Grade 1 anterolisthesis at L4-L5, approximately 4 mm. Status post remote interbody fusion and bilateral posterolateral fixation at L3-L4. SPINAL CORD: The conus medullaris terminates at T12. SOFT TISSUES: No  acute abnormality. L1-L2: No disc herniation. No spinal canal stenosis or neural foraminal narrowing. L2-L3: No disc herniation. No spinal canal stenosis or neural foraminal narrowing. L3-L4: Status post remote interbody fusion and bilateral posterolateral fixation. No disc herniation. No spinal canal stenosis or neural foraminal narrowing. L4-L5: Status post left laminectomy and diskectomy. Grade 1 anterolisthesis, approximately 4 mm. Near complete loss of the disc space. Enhancing granulation tissue in the left lateral recess. Bilateral facet hypertrophic changes contributing to bilateral lateral recess stenosis. Questionable impingement of the L5 nerves in the lateral recesses. L5-S1: No disc herniation. No spinal canal stenosis or neural foraminal narrowing. IMPRESSION: 1. Status post left laminectomy and diskectomy at L4-5 with grade 1 anterolisthesis, near complete loss of the disc space, and enhancing granulation tissue in the left lateral recess. 2. Bilateral facet hypertrophic changes at L4-5 contributing to bilateral lateral recess stenosis with questionable impingement of the L5 nerves. 3. Status post remote interbody fusion and bilateral posterolateral fixation at L3-4. Electronically signed by: Evalene Coho MD 05/20/2024 11:11 AM EDT RP Workstation: [id number]    Antibiotics:  Anti-infectives (From admission, onward)    Start     Dose/Rate Route Frequency Ordered Stop   06/15/24 2000  ceFAZolin  (ANCEF ) IVPB 2g/100 mL premix        2 g 200 mL/hr over 30 Minutes Intravenous Every 8 hours 06/15/24 1356 06/16/24 0437   06/15/24 0615  ceFAZolin  (ANCEF ) IVPB 3g/150 mL premix        3 g 300 mL/hr over 30 Minutes Intravenous On call to O.R. 06/15/24 0606 06/15/24 1158       Discharge Exam: Blood pressure 110/66, pulse (!) 110, temperature 98 F (36.7 C), temperature source Oral, resp. rate 19, height 5' 6 (1.676 m), weight (!) 139.3 kg, SpO2 94%. Neurologic: Grossly normal Ambulating  and voiding well incision cdi   Discharge Medications:   Allergies as of 06/16/2024   No Known Allergies      Medication List     STOP taking these medications    methocarbamol  500 MG tablet Commonly known as: ROBAXIN        TAKE these medications    acetaminophen  500 MG tablet Commonly known as: TYLENOL  Take 1,000 mg by mouth every 8 (eight) hours as needed for moderate pain.   B-12 Compliance Injection 1000 MCG/ML Kit Generic drug: Cyanocobalamin  Inject 1,000 mcg into the muscle every 30 (thirty) days.   celecoxib  200 MG capsule Commonly known as: CELEBREX  Take 200 mg by mouth daily.   chlorhexidine  4 % external liquid Commonly known as: HIBICLENS  Apply 15 mLs (1 Application total) topically as directed for 30 doses. Use as directed daily for 5 days every other week for 6 weeks.   cyclobenzaprine 10 MG tablet Commonly known as: FLEXERIL Take 1 tablet (10 mg total) by mouth at bedtime.   DULoxetine 60 MG capsule Commonly known as: CYMBALTA Take 60 mg by mouth daily.   estradiol  1 MG tablet  Commonly known as: ESTRACE  Take 1 mg by mouth daily.   famotidine  20 MG tablet Commonly known as: PEPCID  Take 20 mg by mouth daily as needed for heartburn or indigestion.   furosemide 20 MG tablet Commonly known as: LASIX Take 20 mg by mouth daily as needed for edema.   gabapentin 300 MG capsule Commonly known as: NEURONTIN Take 300 mg by mouth 2 (two) times daily as needed (pain).   hydrochlorothiazide  25 MG tablet Commonly known as: HYDRODIURIL  Take 25 mg by mouth daily.   ketoconazole 2 % cream Commonly known as: NIZORAL Apply 1 Application topically at bedtime.   lansoprazole 30 MG capsule Commonly known as: PREVACID Take 30 mg by mouth daily.   levothyroxine  112 MCG tablet Commonly known as: SYNTHROID  Take 112 mcg by mouth daily before breakfast.   lisinopril  5 MG tablet Commonly known as: ZESTRIL  Take 5 mg by mouth daily.   loratadine 10 MG  tablet Commonly known as: CLARITIN Take 10 mg by mouth daily as needed for allergies.   metoprolol succinate 25 MG 24 hr tablet Commonly known as: TOPROL-XL Take 25 mg by mouth daily.   mupirocin ointment 2 % Commonly known as: BACTROBAN Place 1 Application into the nose 2 (two) times daily for 60 doses. Use as directed 2 times daily for 5 days every other week for 6 weeks.   oxyCODONE -acetaminophen  5-325 MG tablet Commonly known as: Percocet 2 tablets every 4 hours as needed   potassium chloride  10 MEQ CR capsule Commonly known as: MICRO-K  Take 10 mEq by mouth daily.   predniSONE  10 MG tablet Commonly known as: DELTASONE  Take 10 mg by mouth daily with breakfast.   progesterone  100 MG capsule Commonly known as: PROMETRIUM  Take 100 mg by mouth daily.   Rinvoq  15 MG Tb24 Generic drug: Upadacitinib  ER Take 15 mg by mouth daily.   rosuvastatin  20 MG tablet Commonly known as: CRESTOR  Take 20 mg by mouth daily.   vitamin C 250 MG tablet Commonly known as: ASCORBIC ACID Take 250 mg by mouth daily.   Vitamin D  (Ergocalciferol ) 1.25 MG (50000 UNIT) Caps capsule Commonly known as: DRISDOL  Take 50,000 Units by mouth every Sunday.        Disposition: home   Final Dx: lumbar fusion L4-5  Discharge Instructions      Remove dressing in 72 hours   Complete by: As directed    Call MD for:   Complete by: As directed    Call MD for:  difficulty breathing, headache or visual disturbances   Complete by: As directed    Call MD for:  hives   Complete by: As directed    Call MD for:  persistant dizziness or light-headedness   Complete by: As directed    Call MD for:  persistant nausea and vomiting   Complete by: As directed    Call MD for:  redness, tenderness, or signs of infection (pain, swelling, redness, odor or green/yellow discharge around incision site)   Complete by: As directed    Call MD for:  severe uncontrolled pain   Complete by: As directed    Call MD for:   temperature >100.4   Complete by: As directed    Diet - low sodium heart healthy   Complete by: As directed    Driving Restrictions   Complete by: As directed    No driving for 2 weeks, no riding in the car for 1 week   Incentive spirometry RT   Complete  by: As directed    Increase activity slowly   Complete by: As directed    Lifting restrictions   Complete by: As directed    No lifting more than 8 lbs          Signed: Suzen Lacks Lubertha Leite 06/16/2024, 8:40 AM

## 2024-06-18 NOTE — Anesthesia Postprocedure Evaluation (Signed)
 Anesthesia Post Note  Patient: Lindsay Hansen  Procedure(s) Performed: POSTERIOR LUMBAR INTERBODY FUSION LUMBAR FOUR-LUMBAR FIVE EXTEND FUSION  LUMBAR THREE TO LUMBAR  FIVE     Patient location during evaluation: PACU Anesthesia Type: General Level of consciousness: awake Pain management: pain level controlled Vital Signs Assessment: post-procedure vital signs reviewed and stable Respiratory status: spontaneous breathing Cardiovascular status: blood pressure returned to baseline Postop Assessment: no apparent nausea or vomiting Anesthetic complications: no   There were no known notable events for this encounter.               Lauraine KATHEE [place]

## 2024-06-18 NOTE — Progress Notes (Addendum)
 [facility] Transitional Care Management     Situation   Lindsay Hansen is a 53 y.o. female who was contacted today for a transitional care outreach.  Admission Date: 06/15/24   Discharge Date:06/16/24    Institution: [facility]   Diagnosis:  planned surgery for L4 to L5 fusion   Is this visit eligible for TCM? No  Background  06/18/24 Initial TCM outreach x 1st attempt. HN called patient home # [phone] and spoke with patient.   Since Discharge: HN received notification that patient was discharged from [facility] on 06/16/24 for   Other intervertebral disc degeneration, lumbar region without mention of lumbar back pain or lower extremity pain.  HN spoke with patient and she reports she is doing good. She reports this is her 2nd fusion. She reports her pain is managed by pain medicines.  She reports they have mini wound vac pre vena plus on her incision and the battery will last till this week and then will stop and she can pile it off.  She reports there is a little clear fluid in the tube but no in vac. They put it on just to help to prevent infection.  She denies any s/s of infection that she has noted.  She knows to call the surgeon if develops s/s of infection. She has an iappointment on 12/4 or 12/5 for follow up.  She denies any headache, dizziness. She said she has some coughing at times but thinks from the tube they had down her and and tinge of color to it but related to recent tube down. She is able to cough it up and clear it out.  She denies any fever, chills, chest pain or shortness of breath.  She is eating and drinking good. She denies any issues with bowels or bladder.  No other issues or concerns. HN offered to make her an apt with PCP but patient declined and will just seen surgeon since recently seen PCP and planned surgery.  HN will follow up with patient over the next 30 days.    I reviewed current EMR med list and facility discharge medications. Please see note in  EMR med list for findings.   New medicines:   Bactroban  2% ointment apply 1 application bid for 5 days every other week for 6 weeks Hibiclens  4% liquid apply 15 ml daily for 5 days every other week for 6 weeks Oxycodone  5/325 mg take 2 every 4 hours prn Flexeril  10 mg 1 po every hs  Patient reports that they gave her Bactroban  and Hibiclens - that they did swab and no MRSA but just wanted to have her to take to prevent infection.  She also reports she is holding hormone therapy for 1 week and also the Rinvoq  due to risk to develop clots  I reviewed current EMR med list and facility discharge medications. Please see note in EMR med list for findings.   Primary Care Provider on Record: Patram Melissa Joyce Brown Patram, NP   Assessment    General Assessment     Type of Visit Telephone   Assessment Completed With Patient   Interpreter Used No   Preferred Language English      Flowsheet Row Most Recent Value  Engagement   Call number 1  Two calls made/Contact successful Yes  Is the patient eligible? No [facility], see chart review or query care everywhere for hospital discharge summary. We don't bill for TCM outreach due to [facility] commercial]  What are the reasons patient is not eligible? Other (comment) * [We don't bill for TCM outreach due to [facility] commercial]  Call Start Time 1150  Medications   Appointments   Self Management   Patient Teaching   Wrap Up   Two calls made/Contact successful Yes   List of Assessments:  General Assessment:  What type of visit: Telephone   Assessment completed with: Patient Was an interpreter used?: No Language Preferred: English     Recommendation    PCP/specialist notified: No  Referral Made: No  Referrals made to other disciplines: None   Future Appointments  Date Time Provider Department Center  09/14/2024  8:20 AM Melissa Joyce Brown Patram, NP [facility] [address] [place], [place]  Navigator [phone]    Electronically signed by: Jon Earnie Sharps, RN 06/18/2024 12:30 PM    *Some images could not be shown.

## 2024-06-25 NOTE — Progress Notes (Signed)
 [facility] post TCM follow up ( 10 day follow up)   Has the patient seen their PCP or specialist?:  No Does the patient have COPD and/or CHF?:  No Has the patient had any additional recent utilization (ED or IP in the 3 months prior to the current admission or ED/IP since discharge)?:  No Does the patient require outreach?:  Yes  06/25/24 Day 10. HN called patient home/mobile # [phone] and left message for patient to return my phone call or call PCP/surgeon office if any new issues or concerns   Date of call: 06/25/24   Discharged from: [facility]   Updates/Changes since last encounter: HN called patient for 10 day hospital follow up from most recent stay for planned L4-L5 fusion.  HN called patient home/mobile # and left message to return my phone call or call PCP/surgeon office if any new issues or concerns.   Current Questions/Concerns: N/A  Is patient candidate for Navigation: HN will follow up at 30 day outreach 07/12/24   Jon Sharps, RN [facility] [phone]   Electronically signed by: Jon Earnie Sharps, RN 06/25/2024 2:48 PM

## 2024-07-01 ENCOUNTER — Other Ambulatory Visit: Payer: Self-pay

## 2024-07-01 ENCOUNTER — Emergency Department (HOSPITAL_COMMUNITY)

## 2024-07-01 ENCOUNTER — Inpatient Hospital Stay (HOSPITAL_COMMUNITY)
Admission: EM | Admit: 2024-07-01 | Discharge: 2024-07-04 | DRG: 858 | Disposition: A | Attending: Neurosurgery | Admitting: Neurosurgery

## 2024-07-01 ENCOUNTER — Encounter (HOSPITAL_COMMUNITY): Payer: Self-pay

## 2024-07-01 DIAGNOSIS — T8144XA Sepsis following a procedure, initial encounter: Principal | ICD-10-CM

## 2024-07-01 DIAGNOSIS — T8149XA Infection following a procedure, other surgical site, initial encounter: Secondary | ICD-10-CM | POA: Diagnosis present

## 2024-07-01 LAB — CBC WITH DIFFERENTIAL/PLATELET
Abs Immature Granulocytes: 0.21 K/uL — ABNORMAL HIGH (ref 0.00–0.07)
Basophils Absolute: 0 K/uL (ref 0.0–0.1)
Basophils Relative: 0 %
Eosinophils Absolute: 0 K/uL (ref 0.0–0.5)
Eosinophils Relative: 0 %
HCT: 35.8 % — ABNORMAL LOW (ref 36.0–46.0)
Hemoglobin: 11.1 g/dL — ABNORMAL LOW (ref 12.0–15.0)
Immature Granulocytes: 1 %
Lymphocytes Relative: 5 %
Lymphs Abs: 0.8 K/uL (ref 0.7–4.0)
MCH: 29.9 pg (ref 26.0–34.0)
MCHC: 31 g/dL (ref 30.0–36.0)
MCV: 96.5 fL (ref 80.0–100.0)
Monocytes Absolute: 0.6 K/uL (ref 0.1–1.0)
Monocytes Relative: 4 %
Neutro Abs: 13.3 K/uL — ABNORMAL HIGH (ref 1.7–7.7)
Neutrophils Relative %: 90 %
Platelets: 420 K/uL — ABNORMAL HIGH (ref 150–400)
RBC: 3.71 MIL/uL — ABNORMAL LOW (ref 3.87–5.11)
RDW: 13 % (ref 11.5–15.5)
WBC: 15 K/uL — ABNORMAL HIGH (ref 4.0–10.5)
nRBC: 0 % (ref 0.0–0.2)

## 2024-07-01 LAB — COMPREHENSIVE METABOLIC PANEL WITH GFR
ALT: 54 U/L — ABNORMAL HIGH (ref 0–44)
AST: 50 U/L — ABNORMAL HIGH (ref 15–41)
Albumin: 2.8 g/dL — ABNORMAL LOW (ref 3.5–5.0)
Alkaline Phosphatase: 124 U/L (ref 38–126)
Anion gap: 16 — ABNORMAL HIGH (ref 5–15)
BUN: 7 mg/dL (ref 6–20)
CO2: 21 mmol/L — ABNORMAL LOW (ref 22–32)
Calcium: 9.1 mg/dL (ref 8.9–10.3)
Chloride: 98 mmol/L (ref 98–111)
Creatinine, Ser: 0.84 mg/dL (ref 0.44–1.00)
GFR, Estimated: >60 mL/min (ref >60)
Glucose, Bld: 159 mg/dL — ABNORMAL HIGH (ref 70–99)
Potassium: 3.9 mmol/L (ref 3.5–5.1)
Sodium: 135 mmol/L (ref 135–145)
Total Bilirubin: 0.8 mg/dL (ref 0.0–1.2)
Total Protein: 6.6 g/dL (ref 6.5–8.1)

## 2024-07-01 LAB — RESP PANEL BY RT-PCR (RSV, FLU A&B, COVID)  RVPGX2
Influenza A by PCR: NEGATIVE
Influenza B by PCR: NEGATIVE
Resp Syncytial Virus by PCR: NEGATIVE
SARS Coronavirus 2 by RT PCR: NEGATIVE

## 2024-07-01 LAB — SEDIMENTATION RATE: Sed Rate: 98 mm/h — ABNORMAL HIGH (ref 0–22)

## 2024-07-01 LAB — C-REACTIVE PROTEIN: CRP: 24 mg/dL — ABNORMAL HIGH (ref <1.0)

## 2024-07-01 LAB — I-STAT CG4 LACTIC ACID, ED: Lactic Acid, Venous: 1.8 mmol/L (ref 0.5–1.9)

## 2024-07-01 MED ORDER — LISINOPRIL 5 MG PO TABS
5.0000 mg | ORAL_TABLET | Freq: Every day | ORAL | Status: DC
  Administered 2024-07-02 – 2024-07-03 (×2): 5 mg via ORAL
  Filled 2024-07-01 (×3): qty 1

## 2024-07-01 MED ORDER — ONDANSETRON HCL 4 MG PO TABS
4.0000 mg | ORAL_TABLET | Freq: Four times a day (QID) | ORAL | Status: DC | PRN
  Administered 2024-07-04: 4 mg via ORAL
  Filled 2024-07-01: qty 1

## 2024-07-01 MED ORDER — OXYCODONE-ACETAMINOPHEN 5-325 MG PO TABS
1.0000 | ORAL_TABLET | ORAL | Status: DC | PRN
  Administered 2024-07-02 (×2): 1 via ORAL
  Filled 2024-07-01: qty 1
  Filled 2024-07-01: qty 2
  Filled 2024-07-01: qty 1

## 2024-07-01 MED ORDER — VITAMIN D (ERGOCALCIFEROL) 1.25 MG (50000 UNIT) PO CAPS
50000.0000 [IU] | ORAL_CAPSULE | ORAL | Status: DC
  Filled 2024-07-01: qty 1

## 2024-07-01 MED ORDER — PREDNISONE 10 MG PO TABS
10.0000 mg | ORAL_TABLET | Freq: Every day | ORAL | Status: DC
  Administered 2024-07-04: 10 mg via ORAL
  Filled 2024-07-01: qty 1

## 2024-07-01 MED ORDER — PROGESTERONE MICRONIZED 100 MG PO CAPS
100.0000 mg | ORAL_CAPSULE | Freq: Every day | ORAL | Status: DC
  Administered 2024-07-03 – 2024-07-04 (×2): 100 mg via ORAL
  Filled 2024-07-01 (×3): qty 1

## 2024-07-01 MED ORDER — GADOBUTROL 1 MMOL/ML IV SOLN
10.0000 mL | Freq: Once | INTRAVENOUS | Status: AC | PRN
  Administered 2024-07-01: 10 mL via INTRAVENOUS

## 2024-07-01 MED ORDER — ESTRADIOL 0.5 MG PO TABS
1.0000 mg | ORAL_TABLET | Freq: Every day | ORAL | Status: DC
  Administered 2024-07-03 – 2024-07-04 (×2): 1 mg via ORAL
  Filled 2024-07-01 (×3): qty 2

## 2024-07-01 MED ORDER — LORATADINE 10 MG PO TABS: 10.0000 mg | ORAL_TABLET | Freq: Every day | ORAL | Status: DC | PRN

## 2024-07-01 MED ORDER — MUPIROCIN 2 % EX OINT
1.0000 | TOPICAL_OINTMENT | Freq: Two times a day (BID) | CUTANEOUS | Status: DC
  Administered 2024-07-02 – 2024-07-04 (×6): 1 via NASAL
  Filled 2024-07-01 (×2): qty 22

## 2024-07-01 MED ORDER — DULOXETINE HCL 60 MG PO CPEP
60.0000 mg | ORAL_CAPSULE | Freq: Every day | ORAL | Status: DC
  Administered 2024-07-02 – 2024-07-04 (×3): 60 mg via ORAL
  Filled 2024-07-01 (×3): qty 1

## 2024-07-01 MED ORDER — CELECOXIB 200 MG PO CAPS
200.0000 mg | ORAL_CAPSULE | Freq: Every day | ORAL | Status: DC
  Administered 2024-07-02 – 2024-07-04 (×3): 200 mg via ORAL
  Filled 2024-07-01 (×3): qty 1

## 2024-07-01 MED ORDER — ONDANSETRON HCL 4 MG/2ML IJ SOLN
4.0000 mg | Freq: Once | INTRAMUSCULAR | Status: AC
  Administered 2024-07-01: 4 mg via INTRAVENOUS
  Filled 2024-07-01: qty 2

## 2024-07-01 MED ORDER — CYCLOBENZAPRINE HCL 10 MG PO TABS: 10.0000 mg | ORAL_TABLET | Freq: Three times a day (TID) | ORAL | Status: DC | PRN

## 2024-07-01 MED ORDER — METOPROLOL SUCCINATE ER 25 MG PO TB24
25.0000 mg | ORAL_TABLET | Freq: Every day | ORAL | Status: DC
  Administered 2024-07-02 – 2024-07-04 (×3): 25 mg via ORAL
  Filled 2024-07-01 (×3): qty 1

## 2024-07-01 MED ORDER — DOXYCYCLINE HYCLATE 100 MG PO TABS
100.0000 mg | ORAL_TABLET | Freq: Two times a day (BID) | ORAL | Status: DC
  Administered 2024-07-02 (×3): 100 mg via ORAL
  Filled 2024-07-01 (×3): qty 1

## 2024-07-01 MED ORDER — ONDANSETRON HCL 4 MG/2ML IJ SOLN
4.0000 mg | Freq: Four times a day (QID) | INTRAMUSCULAR | Status: DC | PRN
  Administered 2024-07-02: 4 mg via INTRAVENOUS
  Filled 2024-07-01: qty 2

## 2024-07-01 MED ORDER — HYDROCHLOROTHIAZIDE 25 MG PO TABS
25.0000 mg | ORAL_TABLET | Freq: Every day | ORAL | Status: DC
  Administered 2024-07-02 – 2024-07-04 (×3): 25 mg via ORAL
  Filled 2024-07-01 (×3): qty 1

## 2024-07-01 MED ORDER — PANTOPRAZOLE SODIUM 20 MG PO TBEC
20.0000 mg | DELAYED_RELEASE_TABLET | Freq: Every day | ORAL | Status: DC
  Administered 2024-07-02 – 2024-07-04 (×3): 20 mg via ORAL
  Filled 2024-07-01 (×3): qty 1

## 2024-07-01 MED ORDER — FAMOTIDINE 20 MG PO TABS: 20.0000 mg | ORAL_TABLET | Freq: Every day | ORAL | Status: DC | PRN

## 2024-07-01 MED ORDER — GABAPENTIN 300 MG PO CAPS: 300.0000 mg | ORAL_CAPSULE | Freq: Two times a day (BID) | ORAL | Status: DC | PRN

## 2024-07-01 MED ORDER — IBUPROFEN 800 MG PO TABS
800.0000 mg | ORAL_TABLET | Freq: Once | ORAL | Status: AC
  Administered 2024-07-01: 800 mg via ORAL
  Filled 2024-07-01: qty 1

## 2024-07-01 MED ORDER — FUROSEMIDE 20 MG PO TABS: 20.0000 mg | ORAL_TABLET | Freq: Every day | ORAL | Status: DC | PRN

## 2024-07-01 MED ORDER — MORPHINE SULFATE (PF) 4 MG/ML IV SOLN
4.0000 mg | Freq: Once | INTRAVENOUS | Status: AC
  Administered 2024-07-01: 4 mg via INTRAVENOUS
  Filled 2024-07-01: qty 1

## 2024-07-01 MED ORDER — LEVOTHYROXINE SODIUM 112 MCG PO TABS
112.0000 ug | ORAL_TABLET | Freq: Every day | ORAL | Status: DC
  Administered 2024-07-02 – 2024-07-04 (×2): 112 ug via ORAL
  Filled 2024-07-01 (×2): qty 1

## 2024-07-01 MED ORDER — HYDROMORPHONE HCL 1 MG/ML IJ SOLN
0.5000 mg | INTRAMUSCULAR | Status: DC | PRN
  Administered 2024-07-02: 0.5 mg via INTRAVENOUS
  Administered 2024-07-03: 1 mg via INTRAVENOUS
  Filled 2024-07-01 (×2): qty 1

## 2024-07-01 MED ORDER — ACETAMINOPHEN 650 MG RE SUPP: 650.0000 mg | Freq: Four times a day (QID) | RECTAL | Status: DC | PRN

## 2024-07-01 MED ORDER — ROSUVASTATIN CALCIUM 20 MG PO TABS
20.0000 mg | ORAL_TABLET | Freq: Every day | ORAL | Status: DC
  Administered 2024-07-02 – 2024-07-04 (×3): 20 mg via ORAL
  Filled 2024-07-01 (×3): qty 1

## 2024-07-01 MED ORDER — ACETAMINOPHEN 325 MG PO TABS: 650.0000 mg | ORAL_TABLET | Freq: Four times a day (QID) | ORAL | Status: DC | PRN

## 2024-07-01 MED ORDER — POTASSIUM CHLORIDE CRYS ER 10 MEQ PO TBCR
10.0000 meq | EXTENDED_RELEASE_TABLET | Freq: Every day | ORAL | Status: DC
  Administered 2024-07-02 – 2024-07-04 (×3): 10 meq via ORAL
  Filled 2024-07-01 (×3): qty 1

## 2024-07-01 NOTE — ED Triage Notes (Signed)
 First Nurse Note: Pt had a lumbar fusion earlier this month. This week pt started having drainage from the surgical site and today noticed the drainage is purulent. Reports low grade fever of 100 with chills and night sweats, decreased appetite. Pt started taking Doxycycline 2 days ago.

## 2024-07-01 NOTE — ED Provider Notes (Signed)
 [place] EMERGENCY DEPARTMENT AT [facility] Provider Note   CSN: [id number] Arrival date & time: 07/01/24  8175     Patient presents with: Post-op Problem   Lindsay Hansen is a 53 y.o. female.   53 y.o female with a PMH of HTN, GERD, Anxiety, POSTERIOR LUMBAR INTERBODY FUSION LUMBAR FOUR-LUMBAR FIVE EXTEND FUSION  LUMBAR THREE TO LUMBAR  FIVE on 11/14 by Dr. Val presents to the ED with a chief complaint of post op problem. Patient reports that over the last couple of days her wound has had some increase in discharge, now she notices a foul smell to this discharge.  Approximately 2 days ago she did call the APP for Dr. Lanis who prescribed her doxycycline, she reports she has been taking this medication as prescribed for the past 2 days.  She has experienced an increase in severe pain, she is ambulating with a walker which was not her baseline.  She also endorses a Tmax of 100 at home, has been taking Tylenol  around-the-clock to help with her pain.  She reports normal bowel movements at home.  Has had some decrease in appetite. No other complaints reported.   The history is provided by the patient and medical records.       Prior to Admission medications   Medication Sig Start Date End Date Taking? Authorizing Provider  acetaminophen  (TYLENOL ) 500 MG tablet Take 1,000 mg by mouth every 8 (eight) hours as needed for moderate pain.    [provider]  celecoxib  (CELEBREX ) 200 MG capsule Take 200 mg by mouth daily.    [provider]  chlorhexidine  (HIBICLENS ) 4 % external liquid Apply 15 mLs (1 Application total) topically as directed for 30 doses. Use as directed daily for 5 days every other week for 6 weeks. 06/15/24   Lanis Pupa, MD  Cyanocobalamin  (B-12 COMPLIANCE INJECTION) 1000 MCG/ML KIT Inject 1,000 mcg into the muscle every 30 (thirty) days.    [provider]  cyclobenzaprine  (FLEXERIL ) 10 MG tablet Take 1 tablet (10 mg  total) by mouth at bedtime. 06/16/24   Meyran, Suzen Lacks, NP  DULoxetine  (CYMBALTA ) 60 MG capsule Take 60 mg by mouth daily.    [provider]  estradiol  (ESTRACE ) 1 MG tablet Take 1 mg by mouth daily.    [provider]  famotidine  (PEPCID ) 20 MG tablet Take 20 mg by mouth daily as needed for heartburn or indigestion.    [provider]  furosemide  (LASIX ) 20 MG tablet Take 20 mg by mouth daily as needed for edema. 07/23/22   [provider]  gabapentin  (NEURONTIN ) 300 MG capsule Take 300 mg by mouth 2 (two) times daily as needed (pain).    [provider]  hydrochlorothiazide  (HYDRODIURIL ) 25 MG tablet Take 25 mg by mouth daily. 05/03/20   [provider]  ketoconazole (NIZORAL) 2 % cream Apply 1 Application topically at bedtime. 05/11/24   [provider]  lansoprazole (PREVACID) 30 MG capsule Take 30 mg by mouth daily.    [provider]  levothyroxine  (SYNTHROID ) 112 MCG tablet Take 112 mcg by mouth daily before breakfast.    [provider]  lisinopril  (ZESTRIL ) 5 MG tablet Take 5 mg by mouth daily.    [provider]  loratadine  (CLARITIN ) 10 MG tablet Take 10 mg by mouth daily as needed for allergies.    [provider]  metoprolol  succinate (TOPROL -XL) 25 MG 24 hr tablet Take 25 mg by mouth daily.  [provider]  mupirocin  ointment (BACTROBAN ) 2 % Place 1 Application into the nose 2 (two) times daily for 60 doses. Use as directed 2 times daily for 5 days every other week for 6 weeks. 06/15/24 07/15/24  Lanis Pupa, MD  oxyCODONE -acetaminophen  (PERCOCET) 5-325 MG tablet 2 tablets every 4 hours as needed 06/16/24   Meyran, Suzen Lacks, NP  potassium chloride  (MICRO-K ) 10 MEQ CR capsule Take 10 mEq by mouth daily. 05/20/21   [provider]  predniSONE  (DELTASONE ) 10 MG tablet Take 10 mg by mouth daily with breakfast. 08/10/11   [provider]   progesterone  (PROMETRIUM ) 100 MG capsule Take 100 mg by mouth daily.    [provider]  rosuvastatin  (CRESTOR ) 20 MG tablet Take 20 mg by mouth daily.    [provider]  Upadacitinib  ER (RINVOQ ) 15 MG TB24 Take 15 mg by mouth daily.    [provider]  vitamin C (ASCORBIC ACID) 250 MG tablet Take 250 mg by mouth daily.    [provider]  Vitamin D , Ergocalciferol , (DRISDOL ) 1.25 MG (50000 UNIT) CAPS capsule Take 50,000 Units by mouth every Sunday. 07/17/20   [provider]    Allergies: Patient has no known allergies.    Review of Systems  Constitutional:  Positive for fever.  Respiratory:  Negative for shortness of breath.   Cardiovascular:  Negative for chest pain.  Gastrointestinal:  Negative for abdominal pain, nausea and vomiting.  Genitourinary:  Negative for flank pain.  Musculoskeletal:  Positive for back pain.  Skin:  Positive for color change and wound. Negative for pallor.  Neurological:  Negative for light-headedness and headaches.  Psychiatric/Behavioral:  Negative for confusion.   All other systems reviewed and are negative.   Updated Vital Signs BP 123/66   Pulse (!) 104   Temp 99.1 F (37.3 C)   Resp (!) 26   Ht 5' 6 (1.676 m)   Wt (!) 138.3 kg   SpO2 95%   BMI 49.23 kg/m   Physical Exam Vitals and nursing note reviewed.     (all labs ordered are listed, but only abnormal results are displayed) Labs Reviewed  COMPREHENSIVE METABOLIC PANEL WITH GFR - Abnormal; Notable for the following components:      Result Value   CO2 21 (*)    Glucose, Bld 159 (*)    Albumin  2.8 (*)    AST 50 (*)    ALT 54 (*)    Anion gap 16 (*)    All other components within normal limits  CBC WITH DIFFERENTIAL/PLATELET - Abnormal; Notable for the following components:   WBC 15.0 (*)    RBC 3.71 (*)    Hemoglobin 11.1 (*)    HCT 35.8 (*)    Platelets 420 (*)    Neutro Abs 13.3 (*)    Abs Immature Granulocytes 0.21 (*)     All other components within normal limits  C-REACTIVE PROTEIN - Abnormal; Notable for the following components:   CRP 24.0 (*)    All other components within normal limits  RESP PANEL BY RT-PCR (RSV, FLU A&B, COVID)  RVPGX2  CULTURE, BLOOD (ROUTINE X 2)  CULTURE, BLOOD (ROUTINE X 2)  SEDIMENTATION RATE  I-STAT CG4 LACTIC ACID, ED    EKG: EKG Interpretation Date/Time:  Sunday July 01 2024 19:19:49 EST Ventricular Rate:  120 PR Interval:  138 QRS Duration:  90 QT Interval:  306 QTC Calculation: 433 R Axis:   2  Text Interpretation: Sinus  tachycardia Low voltage, precordial leads Baseline wander in lead(s) III aVF Confirmed by Cottie Cough [phone]) on 07/01/2024 7:23:12 PM  Radiology: DG Chest Portable 1 View Result Date: 07/01/2024 EXAM: 1 VIEW(S) XRAY OF THE CHEST 07/01/2024 09:36:00 PM COMPARISON: 8 / 8 / 11. CLINICAL HISTORY: post op fever FINDINGS: LUNGS AND PLEURA: Low lung volumes. Linear opacities in right mid lung and left lower lung. No pleural effusion. No pneumothorax. HEART AND MEDIASTINUM: No acute abnormality of the cardiac and mediastinal silhouettes. BONES AND SOFT TISSUES: No acute osseous abnormality. IMPRESSION: 1. Linear opacities in the right mid and left lower lungs, favor atelectasis. Electronically signed by: Norman Gatlin MD 07/01/2024 09:48 PM EST RP Workstation: [id number]   MR Lumbar Spine W Wo Contrast Result Date: 07/01/2024 CLINICAL DATA:  Initial evaluation for suspected postoperative infection. EXAM: MRI LUMBAR SPINE WITHOUT AND WITH CONTRAST TECHNIQUE: Multiplanar and multiecho pulse sequences of the lumbar spine were obtained without and with intravenous contrast. CONTRAST:  10mL GADAVIST  GADOBUTROL  1 MMOL/ML IV SOLN COMPARISON:  Prior MRI from 05/17/2024. FINDINGS: Segmentation: Standard. Lowest well-formed disc space labeled the L5-S1 level. Alignment: Chronic 3 mm anterolisthesis of L3 on L4, stable. Mild 3 mm anterolisthesis of L4 on L5,  slightly improved from prior. Vertebrae: Postoperative changes from prior PLIF at L3-4 and L4-5, new at the L4-5 level since prior. Vertebral body height maintained without acute or chronic fracture. Bone marrow signal intensity within normal limits. No worrisome osseous lesions. No evidence for osteomyelitis discitis or septic arthritis. Conus medullaris and cauda equina: Conus extends to the T12 level. Conus and cauda equina appear normal. Paraspinal and other soft tissues: Postoperative changes from recent PLIF present within the lower posterior paraspinous soft tissues. Collection within the subcutaneous fat along the midline surgical incision measures 3.8 x 3.6 x 12.2 cm (series 8, image 14). Some subfascial extension is seen to the underlying laminectomy defect, with deeper component of the collection measuring approximately 3.1 x 2.5 x 2.1 cm (series 11, image 30). No significant surrounding inflammatory changes or enhancement. Finding favored to reflect a postoperative seroma. Disc levels: L1-2:  Unremarkable. L2-3: Disc desiccation without significant disc bulge. Mild facet hypertrophy. No stenosis. L3-4: Prior PLIF. Mild spinal stenosis seen at this level, progressed from prior, likely due to postoperative swelling. Mild right L3 foraminal stenosis. Left neural foramina remains patent. L4-5: Prior PLIF. Postoperative swelling with small collection at the laminectomy site as above. Residual moderate to severe spinal stenosis, worse on the left, somewhat improved from prior exam. Residual moderate bilateral foraminal stenosis. L5-S1: Disc desiccation with mild disc bulge. Epidural lipomatosis. No significant spinal stenosis. Foramina remain patent. IMPRESSION: 1. Postoperative changes from recent PLIF at L4-5. Residual moderate to severe spinal stenosis at this level, somewhat improved from prior exam. Residual moderate bilateral L4 foraminal narrowing. 2. Postoperative swelling with 3.8 x 3.6 x 12.2 cm  collection within the subcutaneous fat along the midline surgical incision, favored to reflect a postoperative seroma, although clinical correlation is advised. No other evidence for acute infection elsewhere within the lumbar spine. 3. Prior PLIF at L3-4 with mild spinal stenosis, mildly worsened from prior due to postoperative swelling. Electronically Signed   By: Morene Hoard M.D.   On: 07/01/2024 21:26     Procedures   Medications Ordered in the ED  morphine  (PF) 4 MG/ML injection 4 mg (4 mg Intravenous Given 07/01/24 2005)  ibuprofen  (ADVIL ) tablet 800 mg (800 mg Oral Given 07/01/24 2004)  ondansetron  (ZOFRAN ) injection 4 mg (4  mg Intravenous Given 07/01/24 2005)  gadobutrol  (GADAVIST ) 1 MMOL/ML injection 10 mL (10 mLs Intravenous Contrast Given 07/01/24 2049)    Clinical Course as of 07/01/24 2212  Sun Jul 01, 2024  2157 CRP(!): 24.0 [JS]    Clinical Course User Index [JS] Briseida Gittings, PA-C                                 Medical Decision Making Amount and/or Complexity of Data Reviewed Labs: ordered. Decision-making details documented in ED Course. Radiology: ordered.  Risk Prescription drug management.  This patient presents to the ED for concern of back pain, this involves a number of treatment options, and is a complaint that carries with it a high risk of complications and morbidity.  The differential diagnosis includes post op pain, infection versus complication.   Co morbidities: Discussed in HPI  Brief History:  See HPI.  EMR reviewed including pt PMHx, past surgical history and past visits to ER.   See HPI for more details  Lab Tests:  I ordered and independently interpreted labs.  The pertinent results include:    CBC with a leukocytosis of 15,000, she is chronically on steroids for psoriatic arthritis, however white count 3 weeks ago was normal.  Hemoglobin is slightly decreased, I feel this is likely from her postop period lactic acid is  normal.  Imaging Studies:  DG Chest portable showed: IMPRESSION:  1. Linear opacities in the right mid and left lower lungs, favor atelectasis.   MRI Lumbar spine with and without contrast: IMPRESSION:  1. Postoperative changes from recent PLIF at L4-5. Residual moderate  to severe spinal stenosis at this level, somewhat improved from  prior exam. Residual moderate bilateral L4 foraminal narrowing.  2. Postoperative swelling with 3.8 x 3.6 x 12.2 cm collection within  the subcutaneous fat along the midline surgical incision, favored to  reflect a postoperative seroma, although clinical correlation is  advised. No other evidence for acute infection elsewhere within the  lumbar spine.  3. Prior PLIF at L3-4 with mild spinal stenosis, mildly worsened  from prior due to postoperative swelling.   Cardiac Monitoring:  The patient was maintained on a cardiac monitor.  I personally viewed and interpreted the cardiac monitored which showed an underlying rhythm of: 120 EKG non-ischemic  Medicines ordered:  I ordered medication including morphine , Zofran , ibuprofen  for symptomatic treatment.  Reevaluation of the patient after these medicines showed that the patient improved I have reviewed the patients home medicines and have made adjustments as needed  Consults:  I requested consultation with Neurosurgery and spoke to [facility] NP,  and discussed lab and imaging findings as well as pertinent plan - they recommend: MRI w/wo contrast, Sed rate and CRP.  Reevaluation:  After the interventions noted above I re-evaluated patient and found that they have :improved  Social Determinants of Health:  The patient's social determinants of health were a factor in the care of this patient  Problem List / ED Course:  Patient presented to the ED with a chief complaint of postop problem.  Patient reports that on November 14 she had a lumbar fusion by Dr. Lanis, she has been doing overall well at  home however 3 days ago, she began to have increasing pain, worsening discharge coming from her wound.  She reports she was at home when she suddenly noticed that the bat that she was wearing to her back was  covered with liquid, she states it had increased from when she was discharged home.  She did not get placed on a oral course of doxycycline which she began 2 days ago.  She has been taking this as prescribed.  Reports worsening symptoms today when she experienced a fever with a Tmax of 100. On arrival she appears somewhat uncomfortable, she does have some purulent drainage from the surgical site, her staples are in place without any signs of wound dehiscence.  There is surrounding erythema and some concern for infection at this time as patient reports that the drainage from the wound has increased in size. Interpretation of her labs reveal CBC with a leukocytosis of 15,000, hemoglobin is at her baseline.  CMP with no electrolyte derangement, LFTs are slightly elevated but she denies any abdominal pain on today's visit.  Chest x-ray was obtained without any signs of infection at this time.  I did discuss this case with neurosurgery [facility] who recommended sed rate, CRP along with MRI lumbar spine with and without. CRP both abnormally elevated.  MRI does show some seroma, however also some increasing fluid collection along with fever suspect likely infection.  Called surgery and spoke to [facility] once again, patient will be admitted by neurosurgery team.  Dispostion:  After consideration of the diagnostic results and the patients response to treatment, I feel that the patent would benefit from neurosurgery    Portions of this note were generated with Dragon dictation software. Dictation errors may occur despite best attempts at proofreading.   Final diagnoses:  Sepsis following procedure, initial encounter    ED Discharge Orders     None          Maureen Broad, PA-C 07/01/24 2212    Cottie Donnice PARAS, MD 07/01/24 2215

## 2024-07-01 NOTE — ED Notes (Signed)
 Floor has been made aware that pt is coming to the floor.

## 2024-07-01 NOTE — H&P (Signed)
 Lindsay Hansen is an 53 y.o. female.   HPI:  53 year old female presented to the ED tonight with drainage from her lumbar wound. She had a PLIF done by Dr. Nundkumar 2 weeks ago. She states that she started having bloody drainage on Thursday of last week. She was placed on doxycycline. Today she noticed the drainage became more purulent and a greenish color. She had a fever yesterday of 100.4. her pain in her back has increased over the last couple days. Denies any pain numbness tingling or weakness in her legs.   Past Medical History:  Diagnosis Date   Allergy    Anemia    past hx- none since menopause    Anxiety    Blood transfusion without reported diagnosis    GERD (gastroesophageal reflux disease)    History of hiatal hernia    Hyperlipidemia    Hypertension    Hypothyroidism    Myasthenia gravis    ocular. in remission as of 2025   Psoriatic arthritis [facility])     Past Surgical History:  Procedure Laterality Date   ABDOMINOPLASTY  2016   nicked blood vessel- hematoma- had to have surgery for the hematoma with blood transfusion    BACK SURGERY  2022   L3-L4 decompression and fusion   CESAREAN SECTION  05-15-2004 and 02-17-2006   CHOLECYSTECTOMY  11/2004   CHONDROPLASTY Right 05/06/2022   Procedure: KNEE ARTHROSCOPY CHONDROPLASTY;  Surgeon: Gerome Charleston, MD;  Location: [facility];  Service: Orthopedics;  Laterality: Right;  knee block   COLONOSCOPY     > 15 yrs ago- normal per pt    gastric sleeve  08/16/2011   KNEE ARTHROSCOPY WITH MEDIAL MENISECTOMY Right 05/06/2022   Procedure: KNEE ARTHROSCOPY WITH PARTIAL MEDIAL MENISECTOMY;  Surgeon: Gerome Charleston, MD;  Location: [facility];  Service: Orthopedics;  Laterality: Right;  knee block   LASIK Bilateral 2004   MICRODISCECTOMY LUMBAR  2023   L5   THYMECTOMY  2014   Laparoscopic   TOTAL KNEE ARTHROPLASTY  01/24/2024   at [place]   UMBILICAL HERNIA REPAIR  08/16/2011    No Known Allergies  Social History   Tobacco  Use   Smoking status: Never   Smokeless tobacco: Never  Substance Use Topics   Alcohol use: Yes    Alcohol/week: 1.0 standard drink of alcohol    Types: 1 Standard drinks or equivalent per week    Comment: Occasionally    Family History  Problem Relation Age of Onset   Asthma Mother    Heart disease Mother    Heart disease Father    Prostate cancer Father    Breast cancer Maternal Aunt 37   Colon cancer Neg Hx    Colon polyps Neg Hx    Esophageal cancer Neg Hx    Rectal cancer Neg Hx    Stomach cancer Neg Hx    BRCA 1/2 Neg Hx      Review of Systems  Positive ROS: as above  All other systems have been reviewed and were otherwise negative with the exception of those mentioned in the HPI and as above.  Objective: Vital signs in last 24 hours: Temp:  [99.1 F (37.3 C)] 99.1 F (37.3 C) (11/30 1831) Pulse Rate:  [104-129] 104 (11/30 2140) Resp:  [17-26] 26 (11/30 2140) BP: (110-144)/(66-90) 123/66 (11/30 2136) SpO2:  [94 %-96 %] 95 % (11/30 2140) Weight:  [138.3 kg] 138.3 kg (11/30 1831)  General Appearance: Alert, cooperative, no distress, appears  stated age Head: Normocephalic, without obvious abnormality, atraumatic Eyes: PERRL, conjunctiva/corneas clear, EOM's intact, fundi benign, both eyes      Lungs: respirations unlabored Skin: purulent drainage from incision Heart: Regular rate and rhythm Pulses: 2+ and symmetric all extremities Skin: Skin color, texture, turgor normal, no rashes or lesions  NEUROLOGIC:   Mental status: A&O x4, no aphasia, good attention span, Memory and fund of knowledge Motor Exam - grossly normal, normal tone and bulk Sensory Exam - grossly normal Reflexes: symmetric, no pathologic reflexes, No Hoffman's, No clonus Coordination - grossly normal Gait - not tested Balance - not tested Cranial Nerves: I: smell Not tested  II: visual acuity  OS: na    OD: na  II: visual fields Full to confrontation  II: pupils Equal, round, reactive  to light  III,VII: ptosis None  III,IV,VI: extraocular muscles  Full ROM  V: mastication Normal  V: facial light touch sensation  Normal  V,VII: corneal reflex  Present  VII: facial muscle function - upper  Normal  VII: facial muscle function - lower Normal  VIII: hearing Not tested  IX: soft palate elevation  Normal  IX,X: gag reflex Present  XI: trapezius strength  5/5  XI: sternocleidomastoid strength 5/5  XI: neck flexion strength  5/5  XII: tongue strength  Normal    Data Review Lab Results  Component Value Date   WBC 15.0 (H) 07/01/2024   HGB 11.1 (L) 07/01/2024   HCT 35.8 (L) 07/01/2024   MCV 96.5 07/01/2024   PLT 420 (H) 07/01/2024   Lab Results  Component Value Date   NA 135 07/01/2024   K 3.9 07/01/2024   CL 98 07/01/2024   CO2 21 (L) 07/01/2024   BUN 7 07/01/2024   CREATININE 0.84 07/01/2024   GLUCOSE 159 (H) 07/01/2024   No results found for: INR, PROTIME  Radiology: DG Chest Portable 1 View Result Date: 07/01/2024 EXAM: 1 VIEW(S) XRAY OF THE CHEST 07/01/2024 09:36:00 PM COMPARISON: 8 / 8 / 11. CLINICAL HISTORY: post op fever FINDINGS: LUNGS AND PLEURA: Low lung volumes. Linear opacities in right mid lung and left lower lung. No pleural effusion. No pneumothorax. HEART AND MEDIASTINUM: No acute abnormality of the cardiac and mediastinal silhouettes. BONES AND SOFT TISSUES: No acute osseous abnormality. IMPRESSION: 1. Linear opacities in the right mid and left lower lungs, favor atelectasis. Electronically signed by: Norman Gatlin MD 07/01/2024 09:48 PM EST RP Workstation: [id number]   MR Lumbar Spine W Wo Contrast Result Date: 07/01/2024 CLINICAL DATA:  Initial evaluation for suspected postoperative infection. EXAM: MRI LUMBAR SPINE WITHOUT AND WITH CONTRAST TECHNIQUE: Multiplanar and multiecho pulse sequences of the lumbar spine were obtained without and with intravenous contrast. CONTRAST:  10mL GADAVIST  GADOBUTROL  1 MMOL/ML IV SOLN COMPARISON:  Prior  MRI from 05/17/2024. FINDINGS: Segmentation: Standard. Lowest well-formed disc space labeled the L5-S1 level. Alignment: Chronic 3 mm anterolisthesis of L3 on L4, stable. Mild 3 mm anterolisthesis of L4 on L5, slightly improved from prior. Vertebrae: Postoperative changes from prior PLIF at L3-4 and L4-5, new at the L4-5 level since prior. Vertebral body height maintained without acute or chronic fracture. Bone marrow signal intensity within normal limits. No worrisome osseous lesions. No evidence for osteomyelitis discitis or septic arthritis. Conus medullaris and cauda equina: Conus extends to the T12 level. Conus and cauda equina appear normal. Paraspinal and other soft tissues: Postoperative changes from recent PLIF present within the lower posterior paraspinous soft tissues. Collection within the subcutaneous fat along the  midline surgical incision measures 3.8 x 3.6 x 12.2 cm (series 8, image 14). Some subfascial extension is seen to the underlying laminectomy defect, with deeper component of the collection measuring approximately 3.1 x 2.5 x 2.1 cm (series 11, image 30). No significant surrounding inflammatory changes or enhancement. Finding favored to reflect a postoperative seroma. Disc levels: L1-2:  Unremarkable. L2-3: Disc desiccation without significant disc bulge. Mild facet hypertrophy. No stenosis. L3-4: Prior PLIF. Mild spinal stenosis seen at this level, progressed from prior, likely due to postoperative swelling. Mild right L3 foraminal stenosis. Left neural foramina remains patent. L4-5: Prior PLIF. Postoperative swelling with small collection at the laminectomy site as above. Residual moderate to severe spinal stenosis, worse on the left, somewhat improved from prior exam. Residual moderate bilateral foraminal stenosis. L5-S1: Disc desiccation with mild disc bulge. Epidural lipomatosis. No significant spinal stenosis. Foramina remain patent. IMPRESSION: 1. Postoperative changes from recent PLIF  at L4-5. Residual moderate to severe spinal stenosis at this level, somewhat improved from prior exam. Residual moderate bilateral L4 foraminal narrowing. 2. Postoperative swelling with 3.8 x 3.6 x 12.2 cm collection within the subcutaneous fat along the midline surgical incision, favored to reflect a postoperative seroma, although clinical correlation is advised. No other evidence for acute infection elsewhere within the lumbar spine. 3. Prior PLIF at L3-4 with mild spinal stenosis, mildly worsened from prior due to postoperative swelling. Electronically Signed   By: Morene Hoard M.D.   On: 07/01/2024 21:26     Assessment/Plan: 53 year old s/p lumbar fusion with suspected infection. CRP is 24, sed rate has not resulted yet. MRI lumbar shows a postoperative subcutaneous fluid collection which is difficult to tell if it is a normal seroma vs infection. Given her elevated CRP, pain, and drainage I lean more towards infection. She will likely need a washout tomorrow. I discussed this with the patient and she understood. NPO after midnight tonight. Will make Dr. Lanis aware of her admission.    Suzen Lacks Maecie Sevcik 07/01/2024 10:14 PM

## 2024-07-02 ENCOUNTER — Other Ambulatory Visit: Payer: Self-pay | Admitting: Neurosurgery

## 2024-07-02 LAB — HIV ANTIBODY (ROUTINE TESTING W REFLEX): HIV Screen 4th Generation wRfx: NONREACTIVE

## 2024-07-02 MED ORDER — DIPHENHYDRAMINE HCL 25 MG PO CAPS
25.0000 mg | ORAL_CAPSULE | Freq: Four times a day (QID) | ORAL | Status: DC | PRN
  Administered 2024-07-02 (×2): 25 mg via ORAL
  Filled 2024-07-02 (×2): qty 1

## 2024-07-02 NOTE — Plan of Care (Signed)

## 2024-07-02 NOTE — Plan of Care (Signed)
  Problem: Clinical Measurements: Goal: Ability to maintain clinical measurements within normal limits will improve Outcome: Progressing   Problem: Coping: Goal: Level of anxiety will decrease Outcome: Progressing   Problem: Skin Integrity: Goal: Risk for impaired skin integrity will decrease Outcome: Progressing   

## 2024-07-02 NOTE — TOC Initial Note (Addendum)
 Transition of Care [facility]) - Initial/Assessment Note    Patient Details  Name: Lindsay Hansen MRN: [id number] Date of Birth: 03-Feb-1971  Transition of Care [facility]) CM/SW Contact:    Rosalva Jon Bloch, RN Phone Number: 07/02/2024, 11:30 AM  Clinical Narrative:                   Infected lumbar wound, s/p recent lumbar infusion 11/14 NCM spoke with pt @ bedside regarding d/c planning. Pt states from home with family. ( husband and 2 young adult children). PTA independent with ADL's. DME @ home , RW and BSC.  PCP confirmed : Cathlyn Nash. Pt without RX med concerns or transportation issues.  Plan: ? Washout today  Inpatient CM team following and will assist with needs....  Expected Discharge Plan: Home/Self Care Barriers to Discharge: Continued Medical Work up   Patient Goals and CMS Choice            Expected Discharge Plan and Services   Discharge Planning Services: CM Consult   Living arrangements for the past 2 months: Single Family Home                                      Prior Living Arrangements/Services Living arrangements for the past 2 months: Single Family Home Lives with:: Spouse, Adult Children Patient language and need for interpreter reviewed:: Yes Do you feel safe going back to the place where you live?: Yes      Need for Family Participation in Patient Care: Yes (Comment) Care giver support system in place?: Yes (comment) Current home services: DME (RW, BSC) Criminal Activity/Legal Involvement Pertinent to Current Situation/Hospitalization: No - Comment as needed  Activities of Daily Living   ADL Screening (condition at time of admission) Independently performs ADLs?: Yes (appropriate for developmental age) Is the patient deaf or have difficulty hearing?: No Does the patient have difficulty seeing, even when wearing glasses/contacts?: No Does the patient have difficulty concentrating, remembering, or making decisions?: No  Permission  Sought/Granted                  Emotional Assessment Appearance:: Appears stated age Attitude/Demeanor/Rapport: Engaged Affect (typically observed): Accepting Orientation: : Oriented to Self, Oriented to Place, Oriented to  Time, Oriented to Situation Alcohol / Substance Use: Not Applicable Psych Involvement: No (comment)  Admission diagnosis:  Postoperative wound infection [T81.49XA] Sepsis following procedure, initial encounter [T81.44XA] Patient Active Problem List   Diagnosis Date Noted   Postoperative wound infection 07/01/2024   Spondylolisthesis of lumbar region 06/15/2024   Spondylolisthesis at L3-L4 level 06/17/2021   Multiple sclerosis 09/19/2011   Morbid obesity (HCC) 09/19/2011   Pleural effusion 09/19/2011   Dyspnea 08/26/2011   Obesity hypoventilation syndrome (HCC) 08/26/2011   PCP:  Nash Cathlyn LABOR., MD Pharmacy:   [facility] [phone] [address] [place], [place] - [address] [place] [address] Phone: [phone] Fax: [phone]     Social Drivers of Health (SDOH) Social History: SDOH Screenings   Food Insecurity: No Food Insecurity (07/02/2024)  Housing: Low Risk  (07/02/2024)  Transportation Needs: No Transportation Needs (07/02/2024)  Utilities: Not At Risk (07/02/2024)  Tobacco Use: Low Risk  (07/01/2024)   SDOH Interventions:     Readmission Risk Interventions     No data to display

## 2024-07-03 ENCOUNTER — Encounter (HOSPITAL_COMMUNITY): Payer: Self-pay | Admitting: Neurosurgery

## 2024-07-03 ENCOUNTER — Inpatient Hospital Stay (HOSPITAL_COMMUNITY): Payer: Self-pay | Admitting: Certified Registered"

## 2024-07-03 ENCOUNTER — Encounter (HOSPITAL_COMMUNITY): Admission: EM | Disposition: A | Payer: Self-pay | Source: Home / Self Care | Attending: Neurosurgery

## 2024-07-03 HISTORY — PX: LUMBAR WOUND DEBRIDEMENT: SHX1988

## 2024-07-03 HISTORY — PX: APPLICATION OF WOUND VAC: SHX5189

## 2024-07-03 SURGERY — LUMBAR WOUND DEBRIDEMENT
Anesthesia: General | Site: Back

## 2024-07-03 MED ORDER — LACTATED RINGERS IV SOLN: INTRAVENOUS | Status: DC

## 2024-07-03 MED ORDER — OXYCODONE HCL 5 MG/5ML PO SOLN: 5.0000 mg | Freq: Once | ORAL | Status: DC | PRN

## 2024-07-03 MED ORDER — ONDANSETRON HCL 4 MG/2ML IJ SOLN
INTRAMUSCULAR | Status: AC
  Filled 2024-07-03: qty 2

## 2024-07-03 MED ORDER — BUPIVACAINE HCL (PF) 0.5 % IJ SOLN
INTRAMUSCULAR | Status: AC
  Filled 2024-07-03: qty 30

## 2024-07-03 MED ORDER — HYDROMORPHONE HCL 1 MG/ML IJ SOLN
0.2500 mg | INTRAMUSCULAR | Status: DC | PRN
  Administered 2024-07-03 (×2): 0.5 mg via INTRAVENOUS

## 2024-07-03 MED ORDER — FENTANYL CITRATE (PF) 100 MCG/2ML IJ SOLN
INTRAMUSCULAR | Status: AC
  Filled 2024-07-03: qty 2

## 2024-07-03 MED ORDER — PHENYLEPHRINE 80 MCG/ML (10ML) SYRINGE FOR IV PUSH (FOR BLOOD PRESSURE SUPPORT)
PREFILLED_SYRINGE | INTRAVENOUS | Status: AC
  Filled 2024-07-03: qty 10

## 2024-07-03 MED ORDER — KETOROLAC TROMETHAMINE 30 MG/ML IJ SOLN
INTRAMUSCULAR | Status: DC | PRN
  Administered 2024-07-03: 30 mg via INTRAVENOUS

## 2024-07-03 MED ORDER — CHLORHEXIDINE GLUCONATE 0.12 % MT SOLN
OROMUCOSAL | Status: AC
  Administered 2024-07-03: 15 mL via OROMUCOSAL
  Filled 2024-07-03: qty 15

## 2024-07-03 MED ORDER — EPHEDRINE 5 MG/ML INJ
INTRAVENOUS | Status: AC
  Filled 2024-07-03: qty 5

## 2024-07-03 MED ORDER — MIDAZOLAM HCL (PF) 2 MG/2ML IJ SOLN
INTRAMUSCULAR | Status: DC | PRN
  Administered 2024-07-03: 2 mg via INTRAVENOUS

## 2024-07-03 MED ORDER — CHLORHEXIDINE GLUCONATE 0.12 % MT SOLN: 15.0000 mL | Freq: Once | OROMUCOSAL | Status: AC

## 2024-07-03 MED ORDER — MIDAZOLAM HCL 2 MG/2ML IJ SOLN
INTRAMUSCULAR | Status: AC
  Filled 2024-07-03: qty 2

## 2024-07-03 MED ORDER — MEPERIDINE HCL 25 MG/ML IJ SOLN: 6.2500 mg | INTRAMUSCULAR | Status: DC | PRN

## 2024-07-03 MED ORDER — ROCURONIUM BROMIDE 10 MG/ML (PF) SYRINGE
PREFILLED_SYRINGE | INTRAVENOUS | Status: DC | PRN
  Administered 2024-07-03: 80 mg via INTRAVENOUS

## 2024-07-03 MED ORDER — THROMBIN 5000 UNITS EX KIT
PACK | CUTANEOUS | Status: AC
  Filled 2024-07-03: qty 1

## 2024-07-03 MED ORDER — THROMBIN 5000 UNITS EX SOLR
OROMUCOSAL | Status: DC | PRN
  Administered 2024-07-03: 5 mL via TOPICAL

## 2024-07-03 MED ORDER — DEXTROSE 5 % IV SOLN
INTRAVENOUS | Status: DC | PRN
  Administered 2024-07-03: 3 g via INTRAVENOUS

## 2024-07-03 MED ORDER — OXYCODONE HCL 5 MG PO TABS: 5.0000 mg | ORAL_TABLET | Freq: Once | ORAL | Status: DC | PRN

## 2024-07-03 MED ORDER — ROCURONIUM BROMIDE 10 MG/ML (PF) SYRINGE
PREFILLED_SYRINGE | INTRAVENOUS | Status: AC
  Filled 2024-07-03: qty 10

## 2024-07-03 MED ORDER — PROPOFOL 10 MG/ML IV BOLUS
INTRAVENOUS | Status: DC | PRN
  Administered 2024-07-03: 150 mg via INTRAVENOUS

## 2024-07-03 MED ORDER — LIDOCAINE 2% (20 MG/ML) 5 ML SYRINGE
INTRAMUSCULAR | Status: DC | PRN
  Administered 2024-07-03: 100 mg via INTRAVENOUS

## 2024-07-03 MED ORDER — DOXYCYCLINE HYCLATE 100 MG PO TABS
100.0000 mg | ORAL_TABLET | Freq: Two times a day (BID) | ORAL | Status: DC
  Administered 2024-07-03 – 2024-07-04 (×3): 100 mg via ORAL
  Filled 2024-07-03 (×3): qty 1

## 2024-07-03 MED ORDER — SULFAMETHOXAZOLE-TRIMETHOPRIM 800-160 MG PO TABS: 1.0000 | ORAL_TABLET | Freq: Two times a day (BID) | ORAL | Status: DC

## 2024-07-03 MED ORDER — SUGAMMADEX SODIUM 200 MG/2ML IV SOLN
INTRAVENOUS | Status: DC | PRN
  Administered 2024-07-03: 400 mg via INTRAVENOUS

## 2024-07-03 MED ORDER — DEXAMETHASONE SOD PHOSPHATE PF 10 MG/ML IJ SOLN
INTRAMUSCULAR | Status: DC | PRN
  Administered 2024-07-03: 10 mg via INTRAVENOUS

## 2024-07-03 MED ORDER — FENTANYL CITRATE (PF) 250 MCG/5ML IJ SOLN
INTRAMUSCULAR | Status: DC | PRN
  Administered 2024-07-03: 100 ug via INTRAVENOUS
  Administered 2024-07-03 (×2): 50 ug via INTRAVENOUS

## 2024-07-03 MED ORDER — PROPOFOL 10 MG/ML IV BOLUS
INTRAVENOUS | Status: AC
  Filled 2024-07-03: qty 20

## 2024-07-03 MED ORDER — HYDROMORPHONE HCL 1 MG/ML IJ SOLN
INTRAMUSCULAR | Status: AC
  Filled 2024-07-03: qty 1

## 2024-07-03 MED ORDER — ORAL CARE MOUTH RINSE: 15.0000 mL | Freq: Once | OROMUCOSAL | Status: AC

## 2024-07-03 MED ORDER — 0.9 % SODIUM CHLORIDE (POUR BTL) OPTIME
TOPICAL | Status: DC | PRN
  Administered 2024-07-03: 1000 mL

## 2024-07-03 MED ORDER — LIDOCAINE 2% (20 MG/ML) 5 ML SYRINGE
INTRAMUSCULAR | Status: AC
  Filled 2024-07-03: qty 5

## 2024-07-03 MED ORDER — CEFAZOLIN SODIUM 1 G IJ SOLR
INTRAMUSCULAR | Status: AC
  Filled 2024-07-03: qty 30

## 2024-07-03 MED ORDER — LIDOCAINE-EPINEPHRINE 1 %-1:100000 IJ SOLN
INTRAMUSCULAR | Status: AC
  Filled 2024-07-03: qty 1

## 2024-07-03 MED ORDER — ONDANSETRON HCL 4 MG/2ML IJ SOLN
INTRAMUSCULAR | Status: DC | PRN
  Administered 2024-07-03: 4 mg via INTRAVENOUS

## 2024-07-03 MED ORDER — KETOROLAC TROMETHAMINE 30 MG/ML IJ SOLN
INTRAMUSCULAR | Status: AC
  Filled 2024-07-03: qty 1

## 2024-07-03 MED ORDER — AMISULPRIDE (ANTIEMETIC) 5 MG/2ML IV SOLN: 10.0000 mg | Freq: Once | INTRAVENOUS | Status: DC | PRN

## 2024-07-03 SURGICAL SUPPLY — 27 items
BAG COUNTER SPONGE SURGICOUNT (BAG) ×2 IMPLANT
CANISTER SUCTION 3000ML PPV (SUCTIONS) ×2 IMPLANT
DRAPE LAPAROTOMY 100X72X124 (DRAPES) ×2 IMPLANT
DRSG VAC GRANUFOAM MED (GAUZE/BANDAGES/DRESSINGS) IMPLANT
ELECTRODE REM PT RTRN 9FT ADLT (ELECTROSURGICAL) ×2 IMPLANT
GAUZE 4X4 16PLY ~~LOC~~+RFID DBL (SPONGE) IMPLANT
GAUZE SPONGE 4X4 12PLY STRL (GAUZE/BANDAGES/DRESSINGS) IMPLANT
GLOVE BIOGEL PI IND STRL 7.5 (GLOVE) ×6 IMPLANT
GLOVE BIOGEL PI MICRO STRL 7 (GLOVE) ×2 IMPLANT
GOWN STRL REUS W/ TWL LRG LVL3 (GOWN DISPOSABLE) ×2 IMPLANT
GOWN STRL REUS W/ TWL XL LVL3 (GOWN DISPOSABLE) IMPLANT
GOWN STRL REUS W/TWL 2XL LVL3 (GOWN DISPOSABLE) IMPLANT
HEMOSTAT POWDER KIT SURGIFOAM (HEMOSTASIS) IMPLANT
KIT BASIN OR (CUSTOM PROCEDURE TRAY) ×2 IMPLANT
KIT TURNOVER KIT B (KITS) ×2 IMPLANT
NDL HYPO 22X1.5 SAFETY MO (MISCELLANEOUS) ×2 IMPLANT
PACK LAMINECTOMY NEURO (CUSTOM PROCEDURE TRAY) ×2 IMPLANT
PAD ARMBOARD POSITIONER FOAM (MISCELLANEOUS) ×6 IMPLANT
SOLN 0.9% NACL POUR BTL 1000ML (IV SOLUTION) ×2 IMPLANT
SOLN STERILE WATER BTL 1000 ML (IV SOLUTION) ×2 IMPLANT
SPONGE SURGIFOAM ABS GEL SZ50 (HEMOSTASIS) IMPLANT
SUT VIC AB 0 CT1 18XCR BRD8 (SUTURE) ×2 IMPLANT
SUT VICRYL 3-0 RB1 18 ABS (SUTURE) ×4 IMPLANT
SWAB COLLECTION DEVICE MRSA (MISCELLANEOUS) IMPLANT
SWAB CULTURE ESWAB REG 1ML (MISCELLANEOUS) IMPLANT
TOWEL GREEN STERILE (TOWEL DISPOSABLE) ×2 IMPLANT
TOWEL GREEN STERILE FF (TOWEL DISPOSABLE) ×2 IMPLANT

## 2024-07-03 NOTE — Anesthesia Procedure Notes (Signed)
 Procedure Name: Intubation Date/Time: 07/03/2024 10:21 AM  Performed by: Delores Dus, CRNAPre-anesthesia Checklist: Patient identified, Emergency Drugs available, Suction available and Patient being monitored Patient Re-evaluated:Patient Re-evaluated prior to induction Oxygen Delivery Method: Circle system utilized Preoxygenation: Pre-oxygenation with 100% oxygen Induction Type: IV induction Ventilation: Mask ventilation without difficulty Laryngoscope Size: Glidescope and 3 Grade View: Grade I Tube type: Oral Tube size: 7.0 mm Number of attempts: 1 Airway Equipment and Method: Stylet and Oral airway Placement Confirmation: ETT inserted through vocal cords under direct vision, positive ETCO2 and breath sounds checked- equal and bilateral Secured at: 21 cm Tube secured with: Tape Dental Injury: Teeth and Oropharynx as per pre-operative assessment

## 2024-07-03 NOTE — Progress Notes (Signed)
  NEUROSURGERY PROGRESS NOTE   No issues overnight.   EXAM:  BP 107/71   Pulse (!) 113   Temp 98.7 F (37.1 C) (Oral)   Resp (!) 22   Ht 5' 6 (1.676 m)   Wt (!) 138.3 kg   SpO2 94%   BMI 49.21 kg/m   Awake, alert, oriented  Speech fluent, appropriate  CN grossly intact  5/5 BUE/BLE  Wound stapled, denuded edges with some erythema and thicker, purulent drainage on dressing  IMAGING: MRI reviewed demonstrating subcutaneous fluid collection, no obvious fluid subfacial, no evidence of osteomyelitis/discitis. Likely some component of paraspinal myositis  IMPRESSION:  53 y.o. female 2 weeks s/p lumbar decompression/extension of fusion presenting with wound drainage, not septic, with imaging demonstrating subcutaneous fluid collection likely representing subcutaneous infection without clear imaging evidence of deep-space infection.  PLAN: - Will proceed with wound exploration/debridement/washout with possible VAC closure  I have reviewed the indications for the procedure as well as the details of the procedure and the expected postoperative course and recovery at length with the patient in the office. We have also reviewed in detail the risks, benefits, and alternatives to the procedure. We discussed likely VAC closure and associated longer-term secondary intention wound healing. We discussed possible need for postoperative antibiotics depending on intraoperative findings. All questions were answered and Lindsay Hansen provided informed consent to proceed.  Gerldine Maizes, MD [facility]     Gerldine Maizes, MD [facility]

## 2024-07-03 NOTE — Anesthesia Preprocedure Evaluation (Addendum)
 Anesthesia Evaluation  Patient identified by MRN, date of birth, ID band Patient awake    Reviewed: Allergy & Precautions, NPO status , Patient's Chart, lab work & pertinent test results  History of Anesthesia Complications Negative for: history of anesthetic complications  Airway Mallampati: II  TM Distance: >3 FB Neck ROM: Full    Dental  (+) Teeth Intact, Dental Advisory Given   Pulmonary    breath sounds clear to auscultation       Cardiovascular hypertension, Pt. on medications  Rhythm:Regular Rate:Normal  TTE (2024): Normal Biventricular Function   Neuro/Psych   Anxiety     Myasthenia Gravis (Ocular, in remission in 2025) s/p Thymectomy  Neuromuscular disease (Myasthenia Gravis s/p Thymectomy)    GI/Hepatic hiatal hernia,GERD  ,,S/p Gastric Sleeve   Endo/Other  Hypothyroidism  Class 3 obesity  Renal/GU      Musculoskeletal  (+) Arthritis  (Psoriatic Arthritis on Prednisone  10 mg daily),    Abdominal  (+) + obese  Peds  Hematology  (+) Blood dyscrasia, anemia Hgb 13.1, Plts 340k (06/05/24)   Anesthesia Other Findings   Reproductive/Obstetrics                              Anesthesia Physical Anesthesia Plan  ASA: 3  Anesthesia Plan: General   Post-op Pain Management:    Induction: Intravenous  PONV Risk Score and Plan: 3 and Ondansetron , Dexamethasone , Treatment may vary due to age or medical condition and Midazolam   Airway Management Planned: Oral ETT  Additional Equipment:   Intra-op Plan:   Post-operative Plan: Extubation in OR  Informed Consent:      Dental advisory given  Plan Discussed with: CRNA  Anesthesia Plan Comments: (PAT note written 06/06/2024 by Allison Zelenak, PA-C. She is a internal medicine physician.  Will plan to give Decadron  10 mg x 1 intraoperatively for stress dose steroids given her use of Prednisone  10 mg daily.  )          Anesthesia Quick Evaluation

## 2024-07-03 NOTE — Consult Note (Signed)
 WOC Nurse Consult Note: Pt had surgery to a back wound today in the OR and is followed by Dr Lanis of the neurosurgical team.  WOC team requested to perform Vac dressing changes.  Pt is still in the perioperative setting. We will change the dressing on Fri as requested.  Thank-you,  Stephane Fought MSN, RN, CWOCN, CWCN-AP, CNS Contact Mon-Fri 0700-1500: [phone]

## 2024-07-03 NOTE — Op Note (Signed)
  NEUROSURGERY OPERATIVE NOTE   PREOP DIAGNOSIS:  Wound infection   POSTOP DIAGNOSIS: Same  PROCEDURE: Wound exploration, debridement, placement of VAC device  SURGEON: Dr. Gerldine Maizes, MD  ASSISTANT: None  ANESTHESIA: General Endotracheal  EBL: Minimal  SPECIMENS: Subcutaneous swabs for aerobic/anaerobic culture  DRAINS: VAC device to -  COMPLICATIONS: None immediate  CONDITION: Hemodynamically stable to PACU  HISTORY: Lindsay Hansen is a 53 y.o. female who underwent lumbar decompression and extension of fusion about 2 weeks ago.  Patient has done well from a symptomatic standpoint, but presented back to the emergency department with low-grade fever and purulent drainage from her wound.  MRI revealed a subcutaneous fluid collection likely representing superficial infection, without clear evidence of deep space or bony involvement.  We therefore elected to proceed with wound exploration and washout with possible VAC placement.  Risks, benefits, and alternatives to surgery were all reviewed in detail with the patient.  After all questions were answered, informed consent was obtained and witnessed.  PROCEDURE IN DETAIL: The patient was brought to the operating room. After induction of general anesthesia, the patient was positioned on the operative table in the prone position. All pressure points were meticulously padded.  The previous wound was then prepped and draped in the usual sterile fashion.  After timeout was conducted, the previous incision was opened sharply.  Interestingly, the wound appeared to be closed superficially.  The knife was used to open the subcutaneous stitches, and the subcutaneous pocket identified on preoperative MRI was identified.  Aerobic and anaerobic culture swabs were taken.  There did not appear to be any frank pus however there was some serosanguineous liquid, and likely some component of fat necrosis.  This was irrigated.  The wound  edges actually appeared to be healthy and bleeding, with a minimal amount of fibrinous exudate which was removed with a brush.  The fascia was then palpated and was completely intact.  I therefore elected not to open the fascial stitches and entered the deep space.  The superficial wound was again irrigated with normal saline.  There appeared to be good healthy tissue along the periphery of the wound.  The wound VAC sponge was then sized and cut and placed into the wound.  Dressing was placed and attached to the vacuum device, with good seal and maintenance of approximately -120 mmHg.  At the end of the case all sponge, needle, and instrument counts were correct. The patient was then transferred to the stretcher, extubated, and taken to the post-anesthesia care unit in stable hemodynamic condition.  Gerldine Maizes, MD [facility]

## 2024-07-03 NOTE — Plan of Care (Signed)
  Problem: Clinical Measurements: Goal: Will remain free from infection Outcome: Not Progressing   Problem: Pain Managment: Goal: General experience of comfort will improve and/or be controlled Outcome: Not Progressing   Problem: Safety: Goal: Ability to remain free from injury will improve Outcome: Not Progressing   Problem: Skin Integrity: Goal: Risk for impaired skin integrity will decrease Outcome: Not Progressing

## 2024-07-03 NOTE — TOC Progression Note (Signed)
 Transition of Care [facility]) - Progression Note    Patient Details  Name: Lindsay Hansen MRN: [id number] Date of Birth: 23-Jul-1971  Transition of Care [facility]) CM/SW Contact  Rosalva Jon Bloch, RN Phone Number: 07/03/2024, 11:46 AM  Clinical Narrative:    Consult received: Home WOC for Home VAC therapy. Pt agreeable to DME /  home health services. Pt without provider preference. Referral made with [facility] for Wound VAC, and [facility] for [facility]. [facility] accepted pt for home health RN services. Authorization pending with [facility]  for home wound vac.  Inpatient CM team following and will continue assisting with needs...  Expected Discharge Plan: Home w Home Health Services Barriers to Discharge: Continued Medical Work up               Expected Discharge Plan and Services   Discharge Planning Services: CM Consult   Living arrangements for the past 2 months: Single Family Home                 DME Arranged: Vac DME Agency: Other - Comment (36M/ [facility]) Date DME Agency Contacted: 07/03/24 Time DME Agency Contacted: [phone] Representative spoke with at DME Agency: [facility] [phone]) HH Arranged: RN HH Agency: [facility] Date HH Agency Contacted: 07/03/24 Time HH Agency Contacted: 1146 Representative spoke with at [facility] Agency: [facility]   Social Drivers of Health (SDOH) Interventions SDOH Screenings   Food Insecurity: No Food Insecurity (07/02/2024)  Housing: Low Risk  (07/02/2024)  Transportation Needs: No Transportation Needs (07/02/2024)  Utilities: Not At Risk (07/02/2024)  Tobacco Use: Low Risk  (07/03/2024)    Readmission Risk Interventions     No data to display

## 2024-07-03 NOTE — Plan of Care (Signed)

## 2024-07-03 NOTE — Transfer of Care (Signed)
 Immediate Anesthesia Transfer of Care Note  Patient: Lindsay Hansen  Procedure(s) Performed: LUMBAR WOUND DEBRIDEMENT (Back) APPLICATION, WOUND VAC  Patient Location: PACU  Anesthesia Type:General  Level of Consciousness: awake, alert , and oriented  Airway & Oxygen Therapy: Patient Spontanous Breathing and Patient connected to face mask oxygen  Post-op Assessment: Report given to RN and Post -op Vital signs reviewed and stable  Post vital signs: Reviewed and stable  Last Vitals:  Vitals Value Taken Time  BP 138/68 07/03/24 11:23  Temp    Pulse 113 07/03/24 11:26  Resp 22 07/03/24 11:26  SpO2 91 % 07/03/24 11:26  Vitals shown include unfiled device data.  Last Pain:  Vitals:   07/03/24 0837  TempSrc:   PainSc: 3       Patients Stated Pain Goal: 2 (07/03/24 9162)  Complications: No notable events documented.

## 2024-07-03 NOTE — Anesthesia Postprocedure Evaluation (Signed)
 Anesthesia Post Note  Patient: Lindsay Hansen  Procedure(s) Performed: LUMBAR WOUND DEBRIDEMENT (Back) APPLICATION, WOUND VAC     Patient location during evaluation: PACU Anesthesia Type: General Level of consciousness: awake and alert Pain management: pain level controlled Vital Signs Assessment: post-procedure vital signs reviewed and stable Respiratory status: spontaneous breathing, nonlabored ventilation and respiratory function stable Cardiovascular status: blood pressure returned to baseline and stable Postop Assessment: no apparent nausea or vomiting Anesthetic complications: no   No notable events documented.  Last Vitals:  Vitals:   07/03/24 1146 07/03/24 1200  BP:  127/73  Pulse: (!) 103 98  Resp: 16 18  Temp:  37.1 C  SpO2: 98% 95%    Last Pain:  Vitals:   07/03/24 1146  TempSrc:   PainSc: 5                  Butler Levander Pinal

## 2024-07-03 NOTE — Plan of Care (Signed)

## 2024-07-04 ENCOUNTER — Encounter (HOSPITAL_COMMUNITY): Payer: Self-pay | Admitting: Neurosurgery

## 2024-07-04 MED ORDER — OXYCODONE-ACETAMINOPHEN 5-325 MG PO TABS: 1.0000 | ORAL_TABLET | Freq: Four times a day (QID) | ORAL | 0 refills | Status: AC | PRN

## 2024-07-04 MED ORDER — DOXYCYCLINE HYCLATE 100 MG PO TABS: 100.0000 mg | ORAL_TABLET | Freq: Two times a day (BID) | ORAL | 0 refills | Status: AC

## 2024-07-04 NOTE — Discharge Summary (Signed)
 Patient ID: Lindsay Hansen MRN: [id number] DOB/AGE: 53/04/1971 53 y.o.  Admit date: 07/01/2024 Discharge date: 07/04/2024  Admission Diagnoses: Postoperative wound infection [T81.49XA] Sepsis following procedure, initial encounter [T81.44XA]   Discharge Diagnoses: Same   Discharged Condition: Stable  Hospital Course:  Lindsay Hansen is a 53 y.o. female with a h/o L3-5 PLIF on 06/15/24 who presented to ED with c/o wound drainage. She underwent wound exploration and debridement with placement of wound VAC on 07/03/24. They were recovered in PACU and transferred to the floor. Hospital course was uncomplicated. Pt stable for discharge today. She will continue PO Doxy and dressing changes per wound care team. Pt to f/u in office for routine post op visit. Pt is in agreement w/ plan.    Discharge Exam: Blood pressure (!) 113/57, pulse (!) 103, temperature 98 F (36.7 C), resp. rate 16, height 5' 6 (1.676 m), weight (!) 138.3 kg, SpO2 98%. A&O Speech fluent, appropriate Strength grossly intact BUE/BLE.  Vac in place, holding suction.   Disposition: Discharge disposition: 01-Home or Self Care       Discharge Instructions     Change dressing (specify)   Complete by: As directed    Per wound care      Allergies as of 07/04/2024   No Known Allergies      Medication List     STOP taking these medications    doxycycline 100 MG capsule Commonly known as: VIBRAMYCIN Replaced by: doxycycline 100 MG tablet       TAKE these medications    acetaminophen  500 MG tablet Commonly known as: TYLENOL  Take 1,000 mg by mouth every 8 (eight) hours as needed for moderate pain.   B-12 Compliance Injection 1000 MCG/ML Kit Generic drug: Cyanocobalamin  Inject 1,000 mcg into the muscle every 30 (thirty) days.   celecoxib  200 MG capsule Commonly known as: CELEBREX  Take 200 mg by mouth daily.   chlorhexidine  4 % external liquid Commonly known as: HIBICLENS  Apply 15 mLs (1  Application total) topically as directed for 30 doses. Use as directed daily for 5 days every other week for 6 weeks.   cyclobenzaprine  10 MG tablet Commonly known as: FLEXERIL  Take 1 tablet (10 mg total) by mouth at bedtime.   docusate sodium  50 MG capsule Commonly known as: COLACE Take 50 mg by mouth 2 (two) times daily.   doxycycline 100 MG tablet Commonly known as: VIBRA-TABS Take 1 tablet (100 mg total) by mouth 2 (two) times daily. Replaces: doxycycline 100 MG capsule   DULoxetine  60 MG capsule Commonly known as: CYMBALTA  Take 60 mg by mouth daily.   estradiol  1 MG tablet Commonly known as: ESTRACE  Take 1 mg by mouth daily.   famotidine  20 MG tablet Commonly known as: PEPCID  Take 20 mg by mouth daily as needed for heartburn or indigestion.   furosemide  20 MG tablet Commonly known as: LASIX  Take 20 mg by mouth daily as needed for edema.   gabapentin  300 MG capsule Commonly known as: NEURONTIN  Take 300 mg by mouth 2 (two) times daily as needed (pain).   gabapentin  100 MG capsule Commonly known as: NEURONTIN  Take 100 mg by mouth 3 (three) times daily.   hydrochlorothiazide  25 MG tablet Commonly known as: HYDRODIURIL  Take 25 mg by mouth daily.   ketoconazole 2 % cream Commonly known as: NIZORAL Apply 1 Application topically at bedtime.   lansoprazole 30 MG capsule Commonly known as: PREVACID Take 30 mg by mouth daily.   levothyroxine  112 MCG tablet Commonly known as:  SYNTHROID  Take 112 mcg by mouth daily before breakfast.   lisinopril  5 MG tablet Commonly known as: ZESTRIL  Take 5 mg by mouth daily.   loratadine  10 MG tablet Commonly known as: CLARITIN  Take 10 mg by mouth daily as needed for allergies.   metoprolol  succinate 25 MG 24 hr tablet Commonly known as: TOPROL -XL Take 25 mg by mouth daily.   mupirocin  ointment 2 % Commonly known as: BACTROBAN  Place 1 Application into the nose 2 (two) times daily for 60 doses. Use as directed 2 times daily  for 5 days every other week for 6 weeks.   potassium chloride  10 MEQ CR capsule Commonly known as: MICRO-K  Take 10 mEq by mouth daily.   predniSONE  10 MG tablet Commonly known as: DELTASONE  Take 10 mg by mouth daily with breakfast.   progesterone  100 MG capsule Commonly known as: PROMETRIUM  Take 100 mg by mouth daily.   Rinvoq  15 MG Tb24 Generic drug: Upadacitinib  ER Take 15 mg by mouth daily.   rosuvastatin  20 MG tablet Commonly known as: CRESTOR  Take 20 mg by mouth daily.   Vitamin D  (Ergocalciferol ) 1.25 MG (50000 UNIT) Caps capsule Commonly known as: DRISDOL  Take 50,000 Units by mouth every Sunday.               Discharge Care Instructions  (From admission, onward)           Start     Ordered   07/04/24 0000  Change dressing (specify)       Comments: Per wound care   07/04/24 0937            Contact information for follow-up providers     Thurmond Cathlyn LABOR., MD Follow up.   Specialty: Internal Medicine Contact information: [address] [place] [address] [phone]              Contact information for after-discharge care     Home Medical Care     [facility]) .   Service: Home Health Services Contact information: [address] [place]  [address] [phone]                     Signed: Fareed Fung CAYLIN Gift Rueckert 07/04/2024, 9:38 AM

## 2024-07-04 NOTE — Progress Notes (Signed)
 Discharge summary (AVS) provided to pt with instructions. Pt verbalized understanding of instructions. NO complaints. Pt d/c to home as ordered,  She remains alert/oriented in no apparent distress, Pt's friend is responsible for her transport.

## 2024-07-04 NOTE — TOC Transition Note (Signed)
 Transition of Care [facility]) - Discharge Note   Patient Details  Name: Lindsay Hansen MRN: [id number] Date of Birth: 1971/02/25  Transition of Care [facility]) CM/SW Contact:  Rosalva Jon Bloch, RN Phone Number: 07/04/2024, 3:00 PM   Clinical Narrative:    Patient will DC to: home Anticipated DC date: 07/04/2024 Family notified: yes Transport by: car  Approval received for home wound vac. Proof of delivery form sign by pt and emailed to [place] with [employer]. Vac provided to pt by NCM.  Per MD patient ready for DC today . RN, patient, patient's family, and [facility] notified of DC.  Per provider dressing changes to occur weekly, SOC on Friday.  Pt without RX med concerns. Pt to pick up meds from local pharmacy.   Family to provide transportation to home.     RNCM will sign off for now as intervention is no longer needed. Please consult us  again if new needs arise.    Final next level of care: Home w Home Health Services Barriers to Discharge: No Barriers Identified   Patient Goals and CMS Choice     Choice offered to / list presented to : Patient      Discharge Placement                       Discharge Plan and Services Additional resources added to the After Visit Summary for     Discharge Planning Services: CM Consult            DME Arranged: Vac DME Agency: Other - Comment (42M/ [employer]) Date DME Agency Contacted: 07/03/24 Time DME Agency Contacted: [phone] Representative spoke with at DME Agency: Randine [phone]) HH Arranged: RN HH Agency: [facility] Date HH Agency Contacted: 07/03/24 Time HH Agency Contacted: 1146 Representative spoke with at [facility] Agency: [facility]  Social Drivers of Health (SDOH) Interventions SDOH Screenings   Food Insecurity: No Food Insecurity (07/02/2024)  Housing: Low Risk  (07/02/2024)  Transportation Needs: No Transportation Needs (07/02/2024)  Utilities: Not At Risk (07/02/2024)  Tobacco Use:  Low Risk  (07/03/2024)     Readmission Risk Interventions     No data to display

## 2024-07-06 LAB — CULTURE, BLOOD (ROUTINE X 2)
Culture: NO GROWTH
Special Requests: ADEQUATE

## 2024-07-07 LAB — CULTURE, BLOOD (ROUTINE X 2)
Culture: NO GROWTH
Special Requests: ADEQUATE

## 2024-07-08 LAB — AEROBIC/ANAEROBIC CULTURE W GRAM STAIN (SURGICAL/DEEP WOUND)

## 2024-07-09 NOTE — Progress Notes (Signed)
 Lindsay Hansen is a 53 y.o.female.  Subjective:   This patient is a hospital follow-up she had undergone surgery for spondylolisthesis of the lumbar region with a posterior lumbar interbody fusion lumbar 3 4 and 5.  She unfortunately developed quite a bit of drainage from the wound itself, she had multiple staples to that wound and she began noticing that it had denuded edges with erythema and thicker purulent drainage on her dressing they did do an MRI which showed there was no evidence of osteomyelitis or discitis but a subcutaneous fluid collection that ultimately was felt to be a seroma so they went ahead and did a wound exploration debridement washout with a VAC closure and she still has a wound VAC in place with home health coming in and a home health nurse to help address this.  She was started on doxycycline  orally she did not receive IV antibiotics but she was likely septic and coming in or at least preseptic as she had tachycardia hypotension elevated sed rate CRP elevation white count elevation and she is at baseline immunocompromise because she is on chronic steroid orally and likely did get a boost of that prior to surgery or during.  She also was on a biologic agent which she held 1 week prior to and she has been holding since the wound exploration and washout.  She states the pain that she went into the surgery with has definitely improved though there is some numbness to the feet she is no longer having the severe pain that had been present prior to the surgery.  The wound culture actually grew out Staph aureus that was not methicillin-resistant.  Her glucose was elevated at 159 her albumin  was low at 2.8 and she had minimal AST and ALT elevations all consistent with her infectious process which we are going to repeat a CBC and a CMP on her today and she has also actually ordered some protein supplement for wound care that should be arriving tomorrow as she has some lower extremity edema and  she is currently not on the gabapentin  which would have exacerbated that  Plan: She actually looks nontoxic on exam today in comparison to the notes that we reviewed while she was here interestingly enough her MRI showed postoperative changes from recent PLIF at L4 and 5 with residual moderate to severe spinal stenosis at that level somewhat improved from previous exam which may be related to still some inflammation at the site with residual moderate bilateral L4 foraminal narrowing and a postoperative swelling with a 3.8 x 3.6 x 12.2 cm collection of fluid along the midline surgical incision which they felt again was a postop seroma and they again felt that her previous mild spinal stenosis was mildly worsened due to postoperative swelling which she again has an appointment with the neurosurgeon on Monday to follow back up with him in regards to how she is doing.  Her postop wound site the wound VAC is exactly where it should be there is no subsequent erythema that is noted to the skin there is no odor the wound VAC itself has appropriate drainage that does not look blood tinted.  But she was able to show me some of the drainage that had been on the dressing that had been attached prior to going to the hospital and there was definitely infection in that with a green tint seen.  She has been able to go up stairs at her house but I think she is really doing  correct by taking things judiciously she has walking canes that she is using in the house for support and she had actually fairly good gait in the office which was impressive with what she has been through and I think there is definitely an agreeable with her physical deconditioning they have not talked about postop physical therapy at this point but she is planning to work a few hours in her office this week and some next week and she has been really careful with it.  She did have some evidence of some oral irritability dryness and what looks like the  beginning of some yeast I did go ahead and send her in some Mycelex  oral to start she does not have to do 4-5 times a day she can do it a couple times a day gauge what how she feels orally but with the oral antibiotics and the immunosuppression I think she is just high risk to develop yeast but there is no complaint on her part from a vaginal yeast infection.  _________________________________    Diagnoses this Encounter 1. Postoperative wound infection  Comprehensive Metabolic Panel   CBC with Differential   Comprehensive Metabolic Panel   CBC with Differential    2. Malaise and fatigue      3. Psoriatic arthritis    (CMD)      4. Inflammatory spondylopathy, unspecified spinal region      5. Myasthenia gravis status post thymectomy    (CMD)      6. Benign hypertension      7. Acquired hypothyroidism      8. Generalized psoriasis      9. Spinal stenosis of lumbosacral region        Chief Complaint  Patient presents with   Hospital Follow-Up   Physical Exam Vitals reviewed.  Constitutional:      Appearance: Normal appearance.     Comments: Slight facial pallor with facial irritability noted on the skin forehead and cheek area  HENT:     Head: Normocephalic.     Right Ear: Tympanic membrane normal.     Left Ear: Tympanic membrane normal.     Nose: No congestion or rhinorrhea.     Mouth/Throat:     Mouth: Mucous membranes are moist.  Eyes:     Pupils: Pupils are equal, round, and reactive to light.  Cardiovascular:     Rate and Rhythm: Normal rate and regular rhythm.     Pulses: Normal pulses.     Heart sounds: Normal heart sounds.  Pulmonary:     Effort: Pulmonary effort is normal.     Breath sounds: Normal breath sounds.  Abdominal:     Palpations: Abdomen is soft.  Musculoskeletal:        General: Normal range of motion.     Cervical back: Normal range of motion.     Comments: Her gait is fairly steady with her walking sticks, she does have bilateral edema  to the ankles with positive palpable pedal pulses  Skin:    General: Skin is warm and dry.     Capillary Refill: Capillary refill takes less than 2 seconds.     Comments: Her wound VAC is intact to the wound and the middle lower spine without any significant erythema there is no drainage leaking from that site and it appears very clean and dry  Neurological:     General: No focal deficit present.     Mental Status: She is alert and oriented to person,  place, and time.     Motor: No weakness.     Coordination: Coordination normal.  Psychiatric:        Mood and Affect: Mood normal.     ______________________ Vitals:   07/09/24 1532  BP: 132/86  BP Location: Left arm  Patient Position: Sitting  Pulse: 104  Resp: 16  Temp: 97.5 F (36.4 C)  TempSrc: Temporal  SpO2: 100%  Weight: (!) 141 kg (310 lb)  Height: 1.676 m (5' 6)    Patient's Medications  New Prescriptions   CLOTRIMAZOLE  (MYCELEX ) 10 MG TROCHE    Take 1 tablet (10 mg total) by mouth 5 (five) times a day for 10 days.  Previous Medications   ACETAMINOPHEN  (TYLENOL ) 500 MG TABLET    Take 500 mg by mouth as needed.   CELECOXIB  (CELEBREX ) 200 MG CAPSULE    Take 200 mg by mouth Once Daily.   CHLORHEXIDINE  (HIBICLENS ) 4 % EXTERNAL LIQUID    Apply 5 mL topically daily. Apply 15 ml daily for 5 days every other week for 6 weeks.   CLOTRIMAZOLE -BETAMETHASONE  (LOTRISONE ) 1-0.05 % LOTION    APPLY TO AFFECTED AREA TWICE A DAY FOR 10 DAYS   COENZYME Q-10 100 MG CAPSULE    Take 200 mg by mouth daily.   CONJUGATED ESTROGENS (PREMARIN) VAGINAL CREAM    Insert 1 g into the vagina Once Daily.   CYCLOBENZAPRINE  (FLEXERIL ) 10 MG TABLET    Take 1 tablet (10 mg total) by mouth 3 (three) times a day as needed for muscle spasms.   DIAZEPAM  (VALIUM ) 10 MG TABLET    Take 1 tablet (10 mg total) by mouth every 12 (twelve) hours as needed for anxiety. Medication refills require office visit every 6 months.   DOCUSATE SODIUM  (COLACE) 50 MG CAPSULE     Take 50 mg by mouth 2 (two) times a day.   DOXYCYCLINE  (VIBRA -TABS) 100 MG TABLET    Take 100 mg by mouth 2 (two) times a day. Take with 8 oz water. Do not lie down for at least 30 minutes after.   DULOXETINE  (CYMBALTA ) 60 MG CAPSULE    TAKE 1 CAPSULE BY MOUTH EVERY DAY   ERGOCALCIFEROL  (VITAMIN D2) 1,250 MCG (50,000 UNIT) CAPSULE    TAKE 1 CAPSULE BY MOUTH ONE TIME PER WEEK   ESTRADIOL  (ESTRACE ) 1 MG TABLET    Take 1 tablet (1 mg total) by mouth daily.   EZETIMIBE (ZETIA) 10 MG TABLET    Take 1 tablet (10 mg total) by mouth daily.   FAMOTIDINE  (PEPCID ) 20 MG TABLET    Take 20 mg by mouth daily.   FUROSEMIDE  (LASIX ) 20 MG TABLET    TAKE 1 TABLET BY MOUTH DAILY AS NEEDED FOR SIGNIFICANT SWELLING   GABAPENTIN  (NEURONTIN ) 100 MG CAPSULE    Take 1 capsule (100 mg total) by mouth daily.   GABAPENTIN  (NEURONTIN ) 300 MG CAPSULE    Take 1 capsule (300 mg total) by mouth in the morning and 1 capsule (300 mg total) in the evening.   HYDROCHLOROTHIAZIDE  (HYDRODIURIL ) 25 MG TABLET    Take 1 tablet (25 mg total) by mouth daily.   KETOCONAZOLE (NIZORAL) 2 % CREAM    Apply topically 2 (two) times a day.   LANSOPRAZOLE (PREVACID) 30 MG DELAYED-RELEASE CAPSULE    TAKE 1 CAPSULE BY MOUTH EVERY DAY   LEVOTHYROXINE  (SYNTHROID ) 112 MCG TABLET    TAKE 1 TABLET (112 MCG TOTAL) BY MOUTH DAILY AT 0600.   LISINOPRIL  (PRINIVIL ) 5  MG TABLET    TAKE 1 TABLET (5 MG TOTAL) BY MOUTH DAILY.   LORATADINE  (CLARITIN ) 10 MG TABLET    Take 10 mg by mouth daily.   METHEN-M.BLUE-S.PHOS-PHSAL-HYO (URIBEL) 118-10-40.8-36 MG CAP    Take as directed   METHOCARBAMOL  (ROBAXIN ) 500 MG TABLET    TAKE 1 TABLET (500 MG TOTAL) BY MOUTH 4 (FOUR) TIMES A DAY.   METOPROLOL  SUCCINATE (TOPROL  XL) 25 MG 24 HR TABLET    TAKE 1 TABLET (25 MG TOTAL) BY MOUTH DAILY.   MUPIROCIN  (BACTROBAN ) 2 % OINTMENT    Apply 1 Application topically 2 (two) times a day. Apply 1 application bid for 5 days every other week x 6 weeks.   OXYCODONE -ACETAMINOPHEN  (PERCOCET)  5-325 MG PER TABLET    Take 1 tablet by mouth every 8 (eight) hours as needed for moderate pain (4-6) Indications: pain. M46.90   PEN NEEDLE, DIABETIC 32 GAUGE X 1/4 NDLE    For use with saxenda daily   POTASSIUM CHLORIDE  (MICRO-K ) 10 MEQ CR CAPSULE    TAKE 1 CAPSULE BY MOUTH 2 TIMES A DAY.   PREDNISONE  (DELTASONE ) 5 MG TABLET    1 TO 2 TABLETS ORALLY ONCE A DAY 90 DAYS   PROGESTERONE  (PROMETRIUM ) 100 MG CAP CAPSULE    Take 1 capsule (100 mg total) by mouth daily.   PYRIDOSTIGMINE (MESTINON) 60 MG TABLET    Take 60 mg by mouth 4 (four) times a day as needed.   ROSUVASTATIN  (CRESTOR ) 20 MG TABLET    Take 1 tablet (20 mg total) by mouth daily.   SERTRALINE  (ZOLOFT ) 50 MG TABLET    TAKE 1 TABLET BY MOUTH EVERY DAY   SILVER SULFADIAZINE (SILVADENE) 1 % CREAM    APPLY TO AFFECTED AREA TOPICALLY EVERY DAY   UPADACITINIB  (RINVOQ ) 15 MG TB24    1 tablet Orally Once a day 30 days   UPADACITINIB  (RINVOQ ) 15 MG TB24    1 tablet Orally Once a day 30 days  Modified Medications   Modified Medication Previous Medication   CYANOCOBALAMIN  (VITAMIN B12) 1,000 MCG/ML INJECTION cyanocobalamin  (VITAMIN B12) 1,000 mcg/mL injection      Inject 1 mL (1,000 mcg total) into the muscle every 30 (thirty) days.    INJECT 1 ML AS DIRECTED EVERY 30 DAYS.  Discontinued Medications   No medications on file   Orders Placed This Encounter  Procedures   Comprehensive Metabolic Panel   CBC with Differential   CBC with Differential   Problem List[1] Medical History[2] Surgical History[3] Family History[4] Social History   Socioeconomic History   Marital status: Married    Spouse name: Not on file   Number of children: Not on file   Years of education: Not on file   Highest education level: Not on file  Occupational History   Not on file  Tobacco Use   Smoking status: Never   Smokeless tobacco: Never  Substance and Sexual Activity   Alcohol use: Yes    Alcohol/week: 0.0 standard drinks of alcohol    Drug use: No   Sexual activity: Yes    Birth control/protection: Female Sterilization  Other Topics Concern   Not on file  Social History Narrative   Not on file   Social Drivers of Health   Living Situation: Low Risk (07/09/2024)   Living Situation    What is your living situation today?: I have a steady place to live    Think about the place you live. Do you have  problems with any of the following? Choose all that apply:: None/None on this list  Food Insecurity: Low Risk (07/09/2024)   Food vital sign    Within the past 12 months, you worried that your food would run out before you got money to buy more: Never true    Within the past 12 months, the food you bought just didn't last and you didn't have money to get more: Never true  Transportation Needs: No Transportation Needs (07/09/2024)   Transportation    In the past 12 months, has lack of reliable transportation kept you from medical appointments, meetings, work or from getting things needed for daily living? : No  Utilities: Low Risk (07/09/2024)   Utilities    In the past 12 months has the electric, gas, oil, or water company threatened to shut off services in your home? : No  Safety: Low Risk (07/09/2024)   Safety    How often does anyone, including family and friends, physically hurt you?: Never    How often does anyone, including family and friends, insult or talk down to you?: Never    How often does anyone, including family and friends, threaten you with harm?: Never    How often does anyone, including family and friends, scream or curse at you?: Never  Alcohol Screening: Not on file  Tobacco Use: Low Risk (07/09/2024)   Patient History    Smoking Tobacco Use: Never    Smokeless Tobacco Use: Never    Passive Exposure: Not on file  Depression: Not At Risk (05/10/2024)   PHQ-2    PHQ-2 Score: 0  Social Connections: Not on file  Financial Resource Strain: Not on file   Allergies[5]  Review of Systems         [1] Patient Active Problem List Diagnosis   Vitamin D  deficiency   Myasthenia gravis, acetylcholine receptor antibody positive    (CMD)   Benign hypertension   Gastroesophageal reflux disease without esophagitis   Psoriatic arthritis    (CMD)   History of laparoscopic partial gastrectomy   Myasthenia gravis status post thymectomy    (CMD)   Recurrent umbilical hernia   Acquired hypothyroidism   Mild episode of recurrent major depressive disorder   Immunization deficiency   Long term (current) use of systemic steroids   Atherosclerosis   Mixed hyperlipidemia   Abnormality of left breast on screening mammogram   Generalized psoriasis   Inflammatory spondylopathy   Pain of meniscus of right knee   Plantar fascial fibromatosis   Spinal stenosis of lumbosacral region   Spondylolisthesis at L3-L4 level   Lumbar stenosis with neurogenic claudication   Malaise and fatigue   Arthritis of right knee   History of iron deficiency   Morbid obesity with body mass index (BMI) of 45.0 to 49.9 in adult (CMD)   Elevated C-reactive protein (CRP)   Osteopenia of multiple sites   Postoperative wound infection  [2] Past Medical History: Diagnosis Date   Anemia 08/11/2011   Arthritis    History of gastroesophageal reflux (GERD)    Hypercholesterolemia    Hypertension complications    Low back pain    Myasthenia gravis (CMD)    Myasthenia gravis (CMD)    Myasthenia gravis, acetylcholine receptor antibody positive    (CMD)    ocular type   Obesity    Psoriasis with arthropathy    (CMD)    Shortness of breath currently NOT an issue   Stress incontinence    Thyroid  dysfunction 08/11/2011  [3] Past Surgical History: Procedure Laterality Date   BACK SURGERY  06/2021   Procedure: BACK SURGERY; L3-L4 Decompression   BELT ABDOMINOPLASTY     Procedure: ABDOMINOPLASTY   BRONCHOSCOPY N/A 01/23/2013   Procedure: BRONCHOSCOPY;  Surgeon:  Rodgers Lynwood Jannell Mickey., MD;  Location: [facility] MAIN OR;  Service: Cardiothoracic;  Laterality: N/A;   CESAREAN SECTION, UNSPECIFIED     Procedure: CESAREAN SECTION; X2   INCISIONAL HERNIA REPAIR N/A 01/28/2015   Procedure: OPEN INCISIONAL HERNIA REPAIR W/MESH;  Surgeon: Wilfred Enzo Guan, MD;  [facility] MAIN OR;  Service: General;  Laterality: N/A;   LAPAROSCOPIC CHOLECYSTECTOMY     Procedure: LAPAROSCOPIC CHOLECYSTECTOMY   PANNICULECTOMY N/A 01/28/2015   Procedure: PANNICULECTOMY (EXCISION EXCESSIVE TISSUE) with liposuction;  Surgeon: Arthor Gracelyn Addison, MD;  Location: [facility] MAIN OR;  Service: Plastics;  Laterality: N/A;   SLEEVE GASTROPLASTY  08/16/2011   Procedure: SLEEVE GASTRECTOMY LAPAROSCOPIC;  Surgeon: Wilfred Enzo Guan, MD;  Location: [facility] MAIN OR;  Service: General;  Laterality: N/A;   SPINAL CORD DECOMPRESSION N/A    Procedure: SPINAL CORD DECOMPRESSION   THYMECTOMY Right 01/23/2013   Procedure: THYMECTOMY ROBOTIC;  Surgeon: Rodgers Lynwood Jannell Mickey., MD;  Location: [facility] MAIN OR;  Service: Cardiothoracic;  Laterality: Right;   UMBILICAL HERNIA REPAIR  08/16/2011   Procedure: UMBILICAL HERNIA REPAIR LAPAROSCOPIC;  Surgeon: Wilfred Enzo Guan, MD;  Location: [facility] MAIN OR;  Service: General;  Laterality: N/A;   WISDOM TOOTH EXTRACTION     Procedure: WISDOM TOOTH EXTRACTION  [4] Family History Problem Relation Name Age of Onset   Diabetes Mother     Hypertension Mother     Sleep apnea Mother     Obesity Mother     Heart disease Mother     Depression Mother     Hypertension Father     Cancer Father     Cancer Maternal Grandmother     Heart disease Maternal Grandfather     Anesthesia problems Neg Hx     Rheumatologic disease Neg Hx    [5] Allergies Allergen Reactions   Gloves, Latex With Aloe Vera Other (See Comments)

## 2024-07-20 ENCOUNTER — Other Ambulatory Visit (HOSPITAL_BASED_OUTPATIENT_CLINIC_OR_DEPARTMENT_OTHER): Payer: Self-pay | Admitting: Obstetrics and Gynecology

## 2024-07-20 DIAGNOSIS — Z1231 Encounter for screening mammogram for malignant neoplasm of breast: Secondary | ICD-10-CM

## 2024-09-07 ENCOUNTER — Ambulatory Visit (HOSPITAL_BASED_OUTPATIENT_CLINIC_OR_DEPARTMENT_OTHER): Admitting: Radiology

## 2024-09-14 ENCOUNTER — Ambulatory Visit (HOSPITAL_BASED_OUTPATIENT_CLINIC_OR_DEPARTMENT_OTHER): Admitting: Radiology
